# Patient Record
Sex: Female | Born: 1970 | Race: White | Hispanic: No | Smoking: Current every day smoker
Health system: Southern US, Community
[De-identification: ages and names within clinical notes are randomized; demographics above are authoritative.]

## PROBLEM LIST (undated history)

## (undated) DIAGNOSIS — Z720 Tobacco use: Secondary | ICD-10-CM

## (undated) DIAGNOSIS — I480 Paroxysmal atrial fibrillation: Secondary | ICD-10-CM

## (undated) HISTORY — DX: Paroxysmal atrial fibrillation: I48.0

## (undated) HISTORY — DX: Morbid (severe) obesity due to excess calories: E66.01

---

## 2004-12-29 ENCOUNTER — Emergency Department: Payer: Self-pay | Admitting: Emergency Medicine

## 2006-07-21 ENCOUNTER — Emergency Department: Payer: Self-pay | Admitting: Internal Medicine

## 2009-10-27 ENCOUNTER — Ambulatory Visit: Payer: Self-pay | Admitting: Adult Health

## 2015-08-11 ENCOUNTER — Encounter: Payer: Self-pay | Admitting: Emergency Medicine

## 2015-08-11 ENCOUNTER — Emergency Department: Payer: Medicaid Other

## 2015-08-11 ENCOUNTER — Observation Stay
Admission: EM | Admit: 2015-08-11 | Discharge: 2015-08-12 | Disposition: A | Discharge status: Home / Self Care | Payer: Medicaid Other | Attending: Internal Medicine | Admitting: Internal Medicine

## 2015-08-11 DIAGNOSIS — E669 Obesity, unspecified: Secondary | ICD-10-CM | POA: Diagnosis not present

## 2015-08-11 DIAGNOSIS — F17200 Nicotine dependence, unspecified, uncomplicated: Secondary | ICD-10-CM | POA: Insufficient documentation

## 2015-08-11 DIAGNOSIS — F419 Anxiety disorder, unspecified: Secondary | ICD-10-CM | POA: Insufficient documentation

## 2015-08-11 DIAGNOSIS — I4891 Unspecified atrial fibrillation: Secondary | ICD-10-CM | POA: Diagnosis present

## 2015-08-11 DIAGNOSIS — G4730 Sleep apnea, unspecified: Secondary | ICD-10-CM | POA: Insufficient documentation

## 2015-08-11 DIAGNOSIS — J40 Bronchitis, not specified as acute or chronic: Secondary | ICD-10-CM | POA: Insufficient documentation

## 2015-08-11 DIAGNOSIS — R000 Tachycardia, unspecified: Secondary | ICD-10-CM | POA: Insufficient documentation

## 2015-08-11 DIAGNOSIS — R42 Dizziness and giddiness: Secondary | ICD-10-CM | POA: Diagnosis not present

## 2015-08-11 DIAGNOSIS — R0602 Shortness of breath: Secondary | ICD-10-CM | POA: Insufficient documentation

## 2015-08-11 DIAGNOSIS — Z7952 Long term (current) use of systemic steroids: Secondary | ICD-10-CM | POA: Diagnosis not present

## 2015-08-11 DIAGNOSIS — F439 Reaction to severe stress, unspecified: Secondary | ICD-10-CM | POA: Insufficient documentation

## 2015-08-11 DIAGNOSIS — R002 Palpitations: Secondary | ICD-10-CM | POA: Diagnosis not present

## 2015-08-11 DIAGNOSIS — F17210 Nicotine dependence, cigarettes, uncomplicated: Secondary | ICD-10-CM | POA: Diagnosis not present

## 2015-08-11 DIAGNOSIS — I480 Paroxysmal atrial fibrillation: Secondary | ICD-10-CM | POA: Diagnosis not present

## 2015-08-11 HISTORY — DX: Tobacco use: Z72.0

## 2015-08-11 LAB — CBC WITH DIFFERENTIAL/PLATELET
BASOS ABS: 0.1 10*3/uL (ref 0–0.1)
Basophils Relative: 1 %
EOS ABS: 0.5 10*3/uL (ref 0–0.7)
EOS PCT: 5 %
HCT: 45.7 % (ref 35.0–47.0)
Hemoglobin: 15.5 g/dL (ref 12.0–16.0)
LYMPHS PCT: 25 %
Lymphs Abs: 2.4 10*3/uL (ref 1.0–3.6)
MCH: 29.9 pg (ref 26.0–34.0)
MCHC: 33.9 g/dL (ref 32.0–36.0)
MCV: 88.4 fL (ref 80.0–100.0)
Monocytes Absolute: 0.9 10*3/uL (ref 0.2–0.9)
Monocytes Relative: 9 %
NEUTROS PCT: 60 %
Neutro Abs: 5.9 10*3/uL (ref 1.4–6.5)
PLATELETS: 202 10*3/uL (ref 150–440)
RBC: 5.17 MIL/uL (ref 3.80–5.20)
RDW: 13.5 % (ref 11.5–14.5)
WBC: 9.7 10*3/uL (ref 3.6–11.0)

## 2015-08-11 LAB — BASIC METABOLIC PANEL
ANION GAP: 6 (ref 5–15)
BUN: 13 mg/dL (ref 6–20)
CO2: 26 mmol/L (ref 22–32)
Calcium: 9.2 mg/dL (ref 8.9–10.3)
Chloride: 109 mmol/L (ref 101–111)
Creatinine, Ser: 0.73 mg/dL (ref 0.44–1.00)
GFR calc Af Amer: >60 mL/min (ref >60)
Glucose, Bld: 105 mg/dL — ABNORMAL HIGH (ref 65–99)
POTASSIUM: 3.8 mmol/L (ref 3.5–5.1)
SODIUM: 141 mmol/L (ref 135–145)

## 2015-08-11 LAB — TSH: TSH: 2.469 u[IU]/mL (ref 0.350–4.500)

## 2015-08-11 LAB — TROPONIN I: Troponin I: <0.03 ng/mL (ref <0.031)

## 2015-08-11 LAB — T4, FREE: FREE T4: 0.75 ng/dL (ref 0.61–1.12)

## 2015-08-11 MED ORDER — ACETAMINOPHEN 325 MG PO TABS
650.0000 mg | ORAL_TABLET | Freq: Four times a day (QID) | ORAL | Status: DC | PRN
Start: 2015-08-11 — End: 2015-08-12

## 2015-08-11 MED ORDER — ACETAMINOPHEN 500 MG PO TABS
1000.0000 mg | ORAL_TABLET | Freq: Once | ORAL | Status: AC
  Administered 2015-08-11: 1000 mg via ORAL
  Filled 2015-08-11: qty 2

## 2015-08-11 MED ORDER — DILTIAZEM HCL 30 MG PO TABS
30.0000 mg | ORAL_TABLET | Freq: Once | ORAL | Status: AC
  Administered 2015-08-11: 30 mg via ORAL
  Filled 2015-08-11: qty 1

## 2015-08-11 MED ORDER — NICOTINE 10 MG IN INHA: 1.0000 | RESPIRATORY_TRACT | Status: DC | PRN

## 2015-08-11 MED ORDER — ACETAMINOPHEN 650 MG RE SUPP: 650.0000 mg | Freq: Four times a day (QID) | RECTAL | Status: DC | PRN

## 2015-08-11 MED ORDER — HEPARIN SODIUM (PORCINE) 5000 UNIT/ML IJ SOLN
5000.0000 [IU] | Freq: Three times a day (TID) | INTRAMUSCULAR | Status: DC
  Administered 2015-08-11 – 2015-08-12 (×3): 5000 [IU] via SUBCUTANEOUS
  Filled 2015-08-11 (×3): qty 1

## 2015-08-11 MED ORDER — ASPIRIN EC 81 MG PO TBEC
81.0000 mg | DELAYED_RELEASE_TABLET | Freq: Every day | ORAL | Status: DC
  Administered 2015-08-11 – 2015-08-12 (×2): 81 mg via ORAL
  Filled 2015-08-11: qty 1

## 2015-08-11 MED ORDER — SODIUM CHLORIDE 0.9 % IJ SOLN: 3.0000 mL | Freq: Two times a day (BID) | INTRAMUSCULAR | Status: DC

## 2015-08-11 MED ORDER — SODIUM CHLORIDE 0.9 % IJ SOLN: 3.0000 mL | INTRAMUSCULAR | Status: DC | PRN

## 2015-08-11 MED ORDER — MORPHINE SULFATE (PF) 2 MG/ML IV SOLN
2.0000 mg | INTRAVENOUS | Status: DC | PRN
  Administered 2015-08-12: 2 mg via INTRAVENOUS
  Filled 2015-08-11: qty 1

## 2015-08-11 MED ORDER — ONDANSETRON HCL 4 MG/2ML IJ SOLN: 4.0000 mg | Freq: Four times a day (QID) | INTRAMUSCULAR | Status: DC | PRN

## 2015-08-11 MED ORDER — NICOTINE 21 MG/24HR TD PT24
21.0000 mg | MEDICATED_PATCH | Freq: Every day | TRANSDERMAL | Status: DC
  Administered 2015-08-11 – 2015-08-12 (×2): 21 mg via TRANSDERMAL
  Filled 2015-08-11 (×2): qty 1

## 2015-08-11 MED ORDER — OXYCODONE HCL 5 MG PO TABS
5.0000 mg | ORAL_TABLET | ORAL | Status: DC | PRN
  Administered 2015-08-11 – 2015-08-12 (×3): 5 mg via ORAL
  Filled 2015-08-11 (×3): qty 1

## 2015-08-11 MED ORDER — ONDANSETRON HCL 4 MG PO TABS: 4.0000 mg | ORAL_TABLET | Freq: Four times a day (QID) | ORAL | Status: DC | PRN

## 2015-08-11 NOTE — ED Notes (Signed)
Report given to floor

## 2015-08-11 NOTE — ED Provider Notes (Signed)
[facility] Emergency Department Provider Note  ____________________________________________  Time seen: Approximately 6:15 PM  I have reviewed the triage vital signs and the nursing notes.   HISTORY  Chief Complaint Atrial Fibrillation    HPI Dominique Sullivan is a 44 y.o. female no chronic medical problems who presents for evaluation of sensation of heart racing, gradual onset today, constant since onset, severe at its maximum but currently mild. The patient reports that she awoke this morning feeling fatigued, tired. She took a nap earlier in the day and when she awoke she continued to feel fatigued, tired, lightheaded but then noted that her heart rate was fast and skipping from time to time. She stopped at the paramedic station where she was noted to be in new onset atrial fibrillation with rapid ventricular rate of 160-180 bpm. She received 20 mg of IV Cardizem with good result. She denies any chest pain, she is having mild shortness of breath. She recently finished antibiotics for bronchitis however has otherwise been in good health.   No past medical history on file.  There are no active problems to display for this patient.   Past Surgical History  Procedure Laterality Date  . Cesarean section      No current outpatient prescriptions on file.  Allergies Review of patient's allergies indicates no known allergies.  No family history on file.  Social History Social History  Substance Use Topics  . Smoking status: Current Every Day Smoker -- 1.00 packs/day  . Smokeless tobacco: Not on file  . Alcohol Use: No    Review of Systems Constitutional: No fever/chills Eyes: No visual changes. ENT: No sore throat. Cardiovascular: Denies chest pain. Respiratory: + shortness of breath. Gastrointestinal: No abdominal pain.  No nausea, no vomiting.  No diarrhea.  No constipation. Genitourinary: Negative for dysuria. Musculoskeletal: Negative for back  pain. Skin: Negative for rash. Neurological: Negative for headaches, focal weakness or numbness.  10-point ROS otherwise negative.  ____________________________________________   PHYSICAL EXAM:  VITAL SIGNS: ED Triage Vitals  Enc Vitals Group     BP 08/11/15 1730 113/82 mmHg     Pulse Rate 08/11/15 1732 80     Resp 08/11/15 1730 20     Temp 08/11/15 1732 98.7 F (37.1 C)     Temp Source 08/11/15 1732 Oral     SpO2 08/11/15 1732 95 %     Weight 08/11/15 1726 230 lb (104.327 kg)     Height 08/11/15 1726  (1.626 m)     Head Cir --      Peak Flow --      Pain Score 08/11/15 1835 2     Pain Loc --      Pain Edu? --      Excl. in GC? --     Constitutional: Alert and oriented. Well appearing and in no acute distress. Eyes: Conjunctivae are normal. PERRL. EOMI. Head: Atraumatic. Nose: No congestion/rhinnorhea. Mouth/Throat: Mucous membranes are moist.  Oropharynx non-erythematous. Neck: No stridor.  Cardiovascular: Normal rate, irrregular rhythm. Grossly normal heart sounds.  Good peripheral circulation. Respiratory: Normal respiratory effort.  No retractions. Lungs CTAB. Gastrointestinal: Soft and nontender. No distention.  No CVA tenderness. Genitourinary: deferred Musculoskeletal: No lower extremity tenderness nor edema.  No joint effusions. Neurologic:  Normal speech and language. No gross focal neurologic deficits are appreciated. No gait instability. Skin:  Skin is warm, dry and intact. No rash noted. Psychiatric: Mood and affect are normal. Speech and behavior are normal.  ____________________________________________   LABS (all labs ordered are listed, but only abnormal results are displayed)  Labs Reviewed  BASIC METABOLIC PANEL - Abnormal; Notable for the following:    Glucose, Bld 105 (*)    All other components within normal limits  CBC WITH DIFFERENTIAL/PLATELET  TROPONIN I  TSH  T4, FREE   ____________________________________________  EKG  ED  ECG REPORT I, Gayla DossGayle, Maimouna Rondeau A, the attending physician, personally viewed and interpreted this ECG.   Date: 08/11/2015  EKG Time: 17:39  Rate: 93  Rhythm: atrial fibrillation, rate 93  Axis: normal  Intervals:none  ST&T Change: No acute ST elevation.  ____________________________________________  RADIOLOGY  CXR  IMPRESSION: No edema or consolidation.  ____________________________________________   PROCEDURES  Procedure(s) performed: None  Critical Care performed: No  ____________________________________________   INITIAL IMPRESSION / ASSESSMENT AND PLAN / ED COURSE  Pertinent labs & imaging results that were available during my care of the patient were reviewed by me and considered in my medical decision making (see chart for details).  Dominique Sullivan is a 44 y.o. female no chronic medical problems who presents for evaluation of sensation of heart racing, gradual onset today, constant since onset, severe at its maximum but currently mild. On arrival to the emergency department, heart rate ranging from 80 bpm to approximately 110 bpm. She is maintaining adequate blood pressure. She still is experiencing palpitations and mild shortness of breath but has no chest pain. Labs reviewed. Normal CBC, BMP, negative troponin, normal TSH and T4. Chest x-ray shows no acute cardiopulmonary process. Clinical picture is consistent with new-onset atrial fibrillation with rapid ventricular rate, symptoms ongoing since this morning. We'll give oral Cardizem. Case discussed with the hospitalist, Dr. Hilton SinclairWeiting, for admission at 8:00 pm. ____________________________________________   FINAL CLINICAL IMPRESSION(S) / ED DIAGNOSES  Final diagnoses:  New onset a-fib (HCC)  Atrial fibrillation with RVR (HCC)      Gayla DossEryka A Azure Budnick, MD 08/11/15 2206

## 2015-08-11 NOTE — H&P (Signed)
[facility] - [place] at [facility]   PATIENT NAME: Dominique Sullivan    MR#:  [id number]  DATE OF BIRTH:  04/11/1971   DATE OF ADMISSION:  08/11/2015  PRIMARY CARE PHYSICIAN: No primary care provider on file.   REQUESTING/REFERRING PHYSICIAN: Inocencio Homes  CHIEF COMPLAINT:   Chief Complaint  Patient presents with  . Atrial Fibrillation    finished antibiotics for bronchitis recently   Palpitations HISTORY OF PRESENT ILLNESS:  Dominique Sullivan  is a 44 y.o. female without significant medical history who is presenting with acute onset of palpitations. She describes heart fluttering sensation which occurred at rest associated lightheadedness, shortness of breath, diaphoresis she went to EMS and found to be tachycardic heart rate in the 180s, she received Cardizem 1 prior to the hospital symptoms have resolved currently. Denies any chest pain, further symptomatology at this time Of note recently finished a course of antibiotics for bronchitis she is also receiving albuterol and taking Sudafed  PAST MEDICAL HISTORY:  History reviewed. No pertinent past medical history.  PAST SURGICAL HISTORY:   Past Surgical History  Procedure Laterality Date  . Cesarean section      SOCIAL HISTORY:   Social History  Substance Use Topics  . Smoking status: Current Every Day Smoker -- 1.00 packs/day  . Smokeless tobacco: Not on file  . Alcohol Use: No    FAMILY HISTORY:   Family History  Problem Relation Age of Onset  . Diabetes Neg Hx     DRUG ALLERGIES:  No Known Allergies  REVIEW OF SYSTEMS:  REVIEW OF SYSTEMS:  CONSTITUTIONAL: Denies fevers, chills, fatigue, weakness.  EYES: Denies blurred vision, double vision, or eye pain.  EARS, NOSE, THROAT: Denies tinnitus, ear pain, hearing loss.  RESPIRATORY: denies cough, shortness of breath, wheezing  CARDIOVASCULAR: Denies chest pain, positive palpitations, denies edema.  GASTROINTESTINAL: Denies nausea, vomiting,  diarrhea, abdominal pain.  GENITOURINARY: Denies dysuria, hematuria.  ENDOCRINE: Denies nocturia or thyroid problems. HEMATOLOGIC AND LYMPHATIC: Denies easy bruising or bleeding.  SKIN: Denies rash or lesions.  MUSCULOSKELETAL: Denies pain in neck, back, shoulder, knees, hips, or further arthritic symptoms.  NEUROLOGIC: Denies paralysis, paresthesias.  PSYCHIATRIC: Denies anxiety or depressive symptoms. Otherwise full review of systems performed by me is negative.   MEDICATIONS AT HOME:   Prior to Admission medications   Medication Sig Start Date End Date Taking? Authorizing Provider  PROAIR RESPICLICK 108 (90 Base) MCG/ACT AEPB Inhale 1-2 puffs into the lungs every 4 (four) hours as needed. 07/24/15  Yes Historical Provider, MD  amoxicillin-clavulanate (AUGMENTIN) 875-125 MG tablet Take 1 tablet by mouth 2 (two) times daily. completed 07/24/15   Historical Provider, MD  predniSONE (DELTASONE) 20 MG tablet Take 2 tablets by mouth daily. completed 07/24/15   Historical Provider, MD      VITAL SIGNS:  Blood pressure 108/87, pulse 87, temperature 98.7 F (37.1 C), temperature source Oral, resp. rate 15, height  (1.626 m), weight 230 lb (104.327 kg), SpO2 94 %.  PHYSICAL EXAMINATION:  VITAL SIGNS: Filed Vitals:   08/11/15 1920 08/11/15 2030  BP: 113/80 108/87  Pulse:  87  Temp:    Resp:  15   GENERAL:44 y.o.female currently in no acute distress.  HEAD: Normocephalic, atraumatic.  EYES: Pupils equal, round, reactive to light. Extraocular muscles intact. No scleral icterus.  MOUTH: Moist mucosal membrane. Dentition intact. No abscess noted.  EAR, NOSE, THROAT: Clear without exudates. No external lesions.  NECK: Supple. No thyromegaly. No nodules. No  JVD.  PULMONARY: Clear to ascultation, without wheeze rails or rhonci. No use of accessory muscles, Good respiratory effort. good air entry bilaterally CHEST: Nontender to palpation.  CARDIOVASCULAR: S1 and S2. Regular rate and  rhythm. No murmurs, rubs, or gallops. No edema. Pedal pulses 2+ bilaterally.  GASTROINTESTINAL: Soft, nontender, nondistended. No masses. Positive bowel sounds. No hepatosplenomegaly.  MUSCULOSKELETAL: No swelling, clubbing, or edema. Range of motion full in all extremities.  NEUROLOGIC: Cranial nerves II through XII are intact. No gross focal neurological deficits. Sensation intact. Reflexes intact.  SKIN: No ulceration, lesions, rashes, or cyanosis. Skin warm and dry. Turgor intact.  PSYCHIATRIC: Mood, affect within normal limits. The patient is awake, alert and oriented x 3. Insight, judgment intact.    LABORATORY PANEL:   CBC  Recent Labs Lab 08/11/15 1746  WBC 9.7  HGB 15.5  HCT 45.7  PLT 202   ------------------------------------------------------------------------------------------------------------------  Chemistries   Recent Labs Lab 08/11/15 1746  NA 141  K 3.8  CL 109  CO2 26  GLUCOSE 105*  BUN 13  CREATININE 0.73  CALCIUM 9.2   ------------------------------------------------------------------------------------------------------------------  Cardiac Enzymes  Recent Labs Lab 08/11/15 1746  TROPONINI <0.03   ------------------------------------------------------------------------------------------------------------------  RADIOLOGY:  Dg Chest Portable 1 View  08/11/2015  CLINICAL DATA:  Tachycardia with palpitations EXAM: PORTABLE CHEST 1 VIEW COMPARISON:  None. FINDINGS: Lungs are clear. Heart is upper normal in size with pulmonary vascularity within normal limits. No adenopathy. No bone lesions. IMPRESSION: No edema or consolidation. Electronically Signed   By: Bretta BangWilliam  Woodruff III M.D.   On: 08/11/2015 17:53    EKG:   Orders placed or performed during the hospital encounter of 08/11/15  . EKG 12-Lead  . EKG 12-Lead    IMPRESSION AND PLAN:   44 year old Caucasian female without significant known medical history who is presenting with  palpitations  1. New onset atrial fibrillation: She is currently in normal sinus rhythm, we will check TSH, echocardiogram place on telemetry trend cardiac enzymes will consult cardiology her chads score currently is 0, start aspirin 2. Venous thromboembolism prophylactic: Heparin subcutaneous    All the records are reviewed and case discussed with ED provider. Management plans discussed with the patient, family and they are in agreement.  CODE STATUS: Full  TOTAL TIME TAKING CARE OF THIS PATIENT: 35 minutes.    Hower,  Mardi MainlandDavid K M.D on 08/11/2015 at 8:56 PM  Between 7am to 6pm - Pager - [phone]  After 6pm: House Pager: - [phone]  [facility]  Office  [phone]  CC: Primary care physician; No primary care provider on file.

## 2015-08-11 NOTE — ED Notes (Signed)
Pt states she doesn't want to stay - tried to advise her why she should ie: new onset afib, no pcp to follow up with. Still wants to leave d/t caring for her mother and her mother has dr appt tomorrow. Let dr know - she will go in and speak with pt.

## 2015-08-11 NOTE — ED Notes (Signed)
Awoke with the feeling of heart racing/ palpitations. Went to ems station to be checked.

## 2015-08-12 ENCOUNTER — Encounter: Payer: Self-pay | Admitting: Physician Assistant

## 2015-08-12 ENCOUNTER — Observation Stay (HOSPITAL_BASED_OUTPATIENT_CLINIC_OR_DEPARTMENT_OTHER)
Admit: 2015-08-12 | Discharge: 2015-08-12 | Disposition: A | Discharge status: Home / Self Care | Payer: Medicaid Other | Attending: Internal Medicine | Admitting: Internal Medicine

## 2015-08-12 DIAGNOSIS — I480 Paroxysmal atrial fibrillation: Secondary | ICD-10-CM

## 2015-08-12 DIAGNOSIS — G478 Other sleep disorders: Secondary | ICD-10-CM

## 2015-08-12 DIAGNOSIS — Z720 Tobacco use: Secondary | ICD-10-CM

## 2015-08-12 DIAGNOSIS — Z638 Other specified problems related to primary support group: Secondary | ICD-10-CM

## 2015-08-12 DIAGNOSIS — F17200 Nicotine dependence, unspecified, uncomplicated: Secondary | ICD-10-CM | POA: Insufficient documentation

## 2015-08-12 DIAGNOSIS — G4730 Sleep apnea, unspecified: Secondary | ICD-10-CM | POA: Insufficient documentation

## 2015-08-12 DIAGNOSIS — I4891 Unspecified atrial fibrillation: Secondary | ICD-10-CM

## 2015-08-12 DIAGNOSIS — F439 Reaction to severe stress, unspecified: Secondary | ICD-10-CM | POA: Insufficient documentation

## 2015-08-12 DIAGNOSIS — J40 Bronchitis, not specified as acute or chronic: Secondary | ICD-10-CM

## 2015-08-12 LAB — TROPONIN I: Troponin I: <0.03 ng/mL (ref <0.031)

## 2015-08-12 LAB — HEMOGLOBIN A1C: Hgb A1c MFr Bld: 5.8 % (ref 4.0–6.0)

## 2015-08-12 MED ORDER — DILTIAZEM HCL ER COATED BEADS 120 MG PO CP24: 120.0000 mg | ORAL_CAPSULE | Freq: Every day | ORAL | Status: DC

## 2015-08-12 MED ORDER — DILTIAZEM HCL ER COATED BEADS 120 MG PO CP24
120.0000 mg | ORAL_CAPSULE | Freq: Every day | ORAL | Status: DC
  Administered 2015-08-12: 120 mg via ORAL
  Filled 2015-08-12: qty 1

## 2015-08-12 MED ORDER — DILTIAZEM HCL 30 MG PO TABS: 30.0000 mg | ORAL_TABLET | Freq: Four times a day (QID) | ORAL | Status: DC | PRN

## 2015-08-12 MED ORDER — ASPIRIN 81 MG PO TBEC
81.0000 mg | DELAYED_RELEASE_TABLET | Freq: Every day | ORAL | Status: AC
Start: 2015-08-12 — End: ?

## 2015-08-12 MED ORDER — LEVALBUTEROL TARTRATE 45 MCG/ACT IN AERO: 1.0000 | INHALATION_SPRAY | Freq: Three times a day (TID) | RESPIRATORY_TRACT | Status: DC

## 2015-08-12 MED ORDER — TIOTROPIUM BROMIDE MONOHYDRATE 18 MCG IN CAPS: 18.0000 ug | ORAL_CAPSULE | Freq: Every day | RESPIRATORY_TRACT | Status: DC

## 2015-08-12 NOTE — Progress Notes (Signed)
*  PRELIMINARY RESULTS* Echocardiogram 2D Echocardiogram has been performed.  Georgann HousekeeperJerry R Hege 08/12/2015, 9:08 AM

## 2015-08-12 NOTE — Progress Notes (Signed)
[facility] - [place] at [facility]   PATIENT NAME: Dominique Sullivan    MR#:  [id number]  DATE OF BIRTH:  12-21-1970  SUBJECTIVE admitted for atrial fibrillation with RVR: The heart rate 160-1 80 bpm received IV Cardizem. Patient is now in sinus rhythm with heart rate 80 bpm seen by cardiology. Started on Cardizem CD 120 mg daily. Patient denies any chest pain. Symptoms started after she recovered from bronchitis she took antibiotics, proair., Sudafed.   Pt denies any complaints.  CHIEF COMPLAINT:   Chief Complaint  Patient presents with  . Atrial Fibrillation    finished antibiotics for bronchitis recently    REVIEW OF SYSTEMS:    Review of Systems  Constitutional: Negative for fever and chills.  HENT: Negative for hearing loss.   Eyes: Negative for blurred vision, double vision and photophobia.  Respiratory: Negative for cough, hemoptysis and shortness of breath.   Cardiovascular: Negative for palpitations, orthopnea and leg swelling.  Gastrointestinal: Negative for vomiting, abdominal pain and diarrhea.  Genitourinary: Negative for dysuria and urgency.  Musculoskeletal: Negative for myalgias and neck pain.  Skin: Negative for rash.  Neurological: Negative for dizziness, focal weakness, seizures, weakness and headaches.  Psychiatric/Behavioral: Negative for memory loss. The patient does not have insomnia.     Nutrition:  Tolerating Diet: Tolerating PT:      DRUG ALLERGIES:  No Known Allergies  VITALS:  Blood pressure 123/74, pulse 101, temperature 98.4 F (36.9 C), temperature source Oral, resp. rate 20, height  (1.626 m), weight 104.327 kg (230 lb), last menstrual period 02/09/2014, SpO2 94 %.  PHYSICAL EXAMINATION:   Physical Exam  GENERAL:  44 y.o.-year-old patient lying in the bed with no acute distress.  EYES: Pupils equal, round, reactive to light and accommodation. No scleral icterus. Extraocular muscles intact.  HEENT: Head  atraumatic, normocephalic. Oropharynx and nasopharynx clear.  NECK:  Supple, no jugular venous distention. No thyroid enlargement, no tenderness.  LUNGS: Normal breath sounds bilaterally, no wheezing, rales,rhonchi or crepitation. No use of accessory muscles of respiration.  CARDIOVASCULAR: S1, S2 normal. No murmurs, rubs, or gallops.  ABDOMEN: Soft, nontender, nondistended. Bowel sounds present. No organomegaly or mass.  EXTREMITIES: No pedal edema, cyanosis, or clubbing.  NEUROLOGIC: Cranial nerves II through XII are intact. Muscle strength 5/5 in all extremities. Sensation intact. Gait not checked.  PSYCHIATRIC: The patient is alert and oriented x 3.  SKIN: No obvious rash, lesion, or ulcer.    LABORATORY PANEL:   CBC  Recent Labs Lab 08/11/15 1746  WBC 9.7  HGB 15.5  HCT 45.7  PLT 202   ------------------------------------------------------------------------------------------------------------------  Chemistries   Recent Labs Lab 08/11/15 1746  NA 141  K 3.8  CL 109  CO2 26  GLUCOSE 105*  BUN 13  CREATININE 0.73  CALCIUM 9.2   ------------------------------------------------------------------------------------------------------------------  Cardiac Enzymes  Recent Labs Lab 08/12/15 0944  TROPONINI <0.03   ------------------------------------------------------------------------------------------------------------------  RADIOLOGY:  Dg Chest Portable 1 View  08/11/2015  CLINICAL DATA:  Tachycardia with palpitations EXAM: PORTABLE CHEST 1 VIEW COMPARISON:  None. FINDINGS: Lungs are clear. Heart is upper normal in size with pulmonary vascularity within normal limits. No adenopathy. No bone lesions. IMPRESSION: No edema or consolidation. Electronically Signed   By: Bretta Bang III M.D.   On: 08/11/2015 17:53     ASSESSMENT AND PLAN:   Active Problems:   New onset atrial fibrillation (HCC)   Paroxysmal atrial fibrillation with rapid ventricular  response (HCC)   Bronchitis  Stress at home   Smoker   Sleep disorder breathing   1. Paroxysmal atrial fibrillation  versus new onset A. fib with RVR: Patient right now in sinus rhythm. Heart rate is stable around 90s. Continue Cardizem CD 120 mg by mouth daily, monitor on telemetry for tonight and monitor for any further arrhythmias. And patient needs outpatient sleep studies for possible sleep apnea. And she also needs 30 day  event monitor. Echocardiogram showed EF more than 60%. Continue aspirin. Cardizem. -Likely in the setting of acute bronchtis, Sudafed, prednisone, and albuterol  2. Bronchitis: Finished treatment. #3 tobacco abuse and smokes about 1 and a pack of cigarettes a day strongly advised to  stop smoking and offered assistance. likley d/c am All the records are reviewed and case discussed with Care Management/Social Workerr. Management plans discussed with the patient, family and they are in agreement.  CODE STATUS:full  TOTAL TIME TAKING CARE OF THIS PATIENT: 35minutes.   POSSIBLE D/C IN *1 DAYS, DEPENDING ON CLINICAL CONDITION.   Katha HammingKONIDENA,Sarahlynn Cisnero M.D on 08/12/2015 at 2:31 PM  Between 7am to 6pm - Pager - [phone]  After 6pm go to [email or web address] - password [email or web address]  [facility]  Office  [phone]  CC: Primary care physician; No primary care provider on file.

## 2015-08-12 NOTE — Care Management (Addendum)
Concern for atrial fibrillation.  Patient currently in NSR.  At present, patient has not been placed on cost prohibitive anticoagulation.  Patient is employed but does not have insurance.  Provide her with coupons from [email or web address] for Cardizem 120mg  CD daily ([facility] 14.91 for #30) and Cardizem 30mg  ([facility] 60 tabs 11.00) Says she will be able to pay for medication.  Provided her application to [facility]

## 2015-08-12 NOTE — Consult Note (Signed)
Cardiology Consultation Note  Patient ID: Dominique Sullivan, MRN: [id number], DOB/AGE: 02-26-1971 44 y.o. Admit date: 08/11/2015   Date of Consult: 08/12/2015 Primary Physician: No primary care provider on file. Primary Cardiologist: New to [facility]  Chief Complaint: Palpitations Reason for Consult: Possible PAF vs new onset Afib with RVR  HPI: 44 y.o. female with h/o possible PAF vs new onset Afib, obesity, possible sleep apnea, and ongoing tobacco abuse who has not seen an MD in the outpatient setting for a routine check up in longer than she can remember presented to [facility] on 12/29 in rate controlled Afib after initially presenting to EMS on her own and was found to be in Afib with RVR with heart rates in the 160s to 180s.    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 She checked her heart rate with her mother's BP cuff at that time with the machine reporting it as being irregular. She did not seek medical care. Since then she has not had any tachy-palpitations until waking up on 12/29. She was recently being treated for acute bronchitis with Augmentin, prednisone, albuterol, and Sudafed. She last took Sudafed the week prior. She was taking her ProAir bid the week piror and had stopped taking this until the evening of 12/28 when she felt like her chest was tightening, thus she took two inhalations prior to going to bed. Upon waking up on 12/29 she reports "not feeling right." She was experiencing tachy-palpitations. She attempted to take her pulse and BP with her mother's BP machine, but got an error message. She was also noted to feel clammy. She proceeded to get ready for work, but decided to stop by EMS on her way where she was found to be in new onset Afib with RVR with heart rate in the 160-180 range. She received IV Cardizem 20 mg bolus with improvement in her heart rate to the 90s. Upon her arrival [facility] she was  noted to be in rate controlled Afib with heart rates in the 90's. She was found to have troponin negative x 4, K+ 3.8, normal TSH/free T4, unremarkable CBC, SCr 0.73, A1C 5.8%. ECG showed Afib, 93 bpm, low voltage precordial leads, no significant st/t changes. CXR showed no edema or consolidation. She was given diltiazem 30 mg x 1 upon her arrival. She converted to NSR on 12/29 around 9 PM. Has had frequent short runs of sinus tach.    Past Medical History  Diagnosis Date  . Obesity   . Tobacco abuse   . A-fib [facility])     a. patient reported tachy-palpitations in 2010 (no documented Afib at that time as she did not seek medical care); b. documented Afib 07/2015      Most Recent Cardiac Studies: None   Surgical History:  Past Surgical History  Procedure Laterality Date  . Cesarean section       Home Meds: Prior to Admission medications   Medication Sig Start Date End Date Taking? Authorizing Provider  PROAIR RESPICLICK 108 (90 Base) MCG/ACT AEPB Inhale 1-2 puffs into the lungs every 4 (four) hours as needed. 07/24/15  Yes Historical Provider, MD  amoxicillin-clavulanate (AUGMENTIN) 875-125 MG tablet Take 1 tablet by mouth 2 (two) times daily. completed 07/24/15   Historical Provider, MD  predniSONE (DELTASONE) 20 MG tablet Take 2 tablets by mouth daily. completed 07/24/15   Historical Provider, MD    Inpatient Medications:  . aspirin EC  81 mg Oral Daily  . heparin  5,000 Units Subcutaneous 3 times per day  . nicotine  21 mg Transdermal Daily      Allergies: No Known Allergies  Social History   Social History  . Marital Status: Divorced    Spouse Name: N/A  . Number of Children: N/A  . Years of Education: N/A   Occupational History  . Not on file.   Social History Main Topics  . Smoking status: Current Every Day Smoker -- 1.00 packs/day for 31 years    Types: Cigarettes  . Smokeless tobacco: Not on file  . Alcohol Use: 0.0 oz/week    0 Standard drinks or equivalent  per week  . Drug Use: No     Comment: remote MJ use as teenager - denies any other illegal drug abuse  . Sexual Activity: Not on file   Other Topics Concern  . Not on file   Social History Narrative     Family History  Problem Relation Age of Onset  . Diabetes Mother   . Hypertension Mother   . Stroke Maternal Grandmother   . Stroke Maternal Grandfather   . Hyperlipidemia Father      Review of Systems: Review of Systems  Constitutional: Positive for malaise/fatigue. Negative for fever, chills, weight loss and diaphoresis.       Improved  HENT: Negative for congestion.   Eyes: Negative for discharge and redness.  Respiratory: Negative for cough, hemoptysis, sputum production, shortness of breath and wheezing.   Cardiovascular: Positive for palpitations. Negative for chest pain, orthopnea, claudication, leg swelling and PND.       Improved  Gastrointestinal: Negative for nausea and vomiting.  Musculoskeletal: Positive for back pain. Negative for falls.  Skin: Negative for rash.  Neurological: Positive for weakness. Negative for dizziness, sensory change, speech change, focal weakness and loss of consciousness.  Endo/Heme/Allergies: Does not bruise/bleed easily.  Psychiatric/Behavioral: Positive for substance abuse. The patient is nervous/anxious.        Ongoing tobacco abuse, rare etoh use, remote MJ abuse as a teenager - denies any other illegal drug abuse  All other systems reviewed and are negative.    Labs:  Recent Labs  08/11/15 1746 08/11/15 2257 08/12/15 0340 08/12/15 0944  TROPONINI <0.03 <0.03 <0.03 <0.03   Lab Results  Component Value Date   WBC 9.7 08/11/2015   HGB 15.5 08/11/2015   HCT 45.7 08/11/2015   MCV 88.4 08/11/2015   PLT 202 08/11/2015     Recent Labs Lab 08/11/15 1746  NA 141  K 3.8  CL 109  CO2 26  BUN 13  CREATININE 0.73  CALCIUM 9.2  GLUCOSE 105*   No results found for: CHOL, HDL, LDLCALC, TRIG No results found for:  DDIMER  Radiology/Studies:  Dg Chest Portable 1 View  08/11/2015  CLINICAL DATA:  Tachycardia with palpitations EXAM: PORTABLE CHEST 1 VIEW COMPARISON:  None. FINDINGS: Lungs are clear. Heart is upper normal in size with pulmonary vascularity within normal limits. No adenopathy. No bone lesions. IMPRESSION: No edema or consolidation. Electronically Signed   By: Bretta BangWilliam  Woodruff III M.D.   On: 08/11/2015 17:53    EKG:  Afib, 93 bpm, low voltage precordial leads, no significant st/t changes  Weights: Filed Weights   08/11/15 1726  Weight: 230 lb (104.327 kg)     Physical Exam: Blood pressure 113/80, pulse 85, temperature 98.6 F (37 C), temperature source Oral, resp. rate 16, height 5\' 4"  (1.626 m), weight 230  lb (104.327 kg), last menstrual period 02/09/2014, SpO2 98 %. Body mass index is 39.46 kg/(m^2). General: Well developed, well nourished, in no acute distress. Head: Normocephalic, atraumatic, sclera non-icteric, no xanthomas, nares are without discharge.  Neck: Negative for carotid bruits. JVD not elevated. Lungs: Clear bilaterally to auscultation without wheezes, rales, or rhonchi. Breathing is unlabored. Heart: RRR with S1 S2. No murmurs, rubs, or gallops appreciated. Abdomen: Obese, soft, non-tender, non-distended with normoactive bowel sounds. No hepatomegaly. No rebound/guarding. No obvious abdominal masses. Msk:  Strength and tone appear normal for age. Extremities: No clubbing or cyanosis. No edema.  Distal pedal pulses are 2+ and equal bilaterally. Neuro: Alert and oriented X 3. No facial asymmetry. No focal deficit. Moves all extremities spontaneously. Psych:  Responds to questions appropriately with a normal affect.    Assessment and Plan:   1. PAF vs new onset Afib with RVR: -Currently in sinus rhythm with heart rate in the 90's to low 100's with movement -Would want to decrease heart rate to further decrease chances of going back into Afib  -Likely in the  setting of acute broncitis, Sudafed, prednisone, and albuterol  -Cannot rule out undiagnosed sleep apnea. Recommend outpatient sleep study (possible snoring, she lives with her deaf mother) -Under significant amount of stress with her living situation/mother/children -Add Cardizem 120 mg daily with diltiazem 30 mg prn tachy-palpitations  -Very symptomatic with her tachy-palpitations  -Plan for outpatient follow up to assess how she is doing. Consider 30 day event monitor at that time should she be having further tachy-palpitations given this was her 2nd episode in 6 years  -Echo pending to evaluate LV function and wall motion  -CHADS2VASc at least 1 (female), making risk of long term, full-dose anticoagulation greater than benefit at this time  2. Recent bronchitis: -Was taking Augmentin, prednisone, albuterol, and Sudafed. Most recently took [employer] just prior to the onset of her Afib  -Her acute infection and medications above certainly are likely to play a role in her new onset Afib  3. Possible sleep apnea/obesity: -Recommend outpatient sleep study -Weight loss advised  4. Tobacco abuse: -Cessation advised  5. Stress/anxiety: -Consider yoga/meditation   Signed, Eula Listen, PA-C Pager: [phone] 08/12/2015, 11:00 AM

## 2015-08-15 MED ORDER — DILTIAZEM HCL 30 MG PO TABS: 30.0000 mg | ORAL_TABLET | Freq: Four times a day (QID) | ORAL | Status: DC | PRN

## 2015-08-16 ENCOUNTER — Telehealth: Payer: Self-pay

## 2015-08-16 MED ORDER — DILTIAZEM HCL 30 MG PO TABS: 30.0000 mg | ORAL_TABLET | Freq: Four times a day (QID) | ORAL | Status: DC | PRN

## 2015-08-16 NOTE — Telephone Encounter (Signed)
-----   Message from Christell Faithraci L Tekely sent at 08/16/2015  1:23 PM EST ----- Regarding: tcm/ph 08/26/15 @ 9:30 C. Brion AlimentBerge, NP

## 2015-08-16 NOTE — Discharge Summary (Signed)
Dominique Sullivan, is a 45 y.o. female  DOB Nov 07, 1970  MRN [id number].  Admission date:  08/11/2015  Admitting Physician  Wyatt Hasteavid K Hower, MD  Discharge Date:  08/12/2015   Primary MD  No primary care provider on file.  Recommendations for primary care physician for things to follow:  Follow up with Dr.Tim Gollan    Admission Diagnosis  New onset a-fib [facility](HCC) [I48.91] Atrial fibrillation with RVR (HCC) [I48.91]   Discharge Diagnosis  New onset a-fib (HCC) [I48.91] Atrial fibrillation with RVR (HCC) [I48.91]    Active Problems:   New onset atrial fibrillation (HCC)   Paroxysmal atrial fibrillation with rapid ventricular response (HCC)   Bronchitis   Stress at home   Smoker   Sleep disorder breathing      Past Medical History  Diagnosis Date  . Obesity   . Tobacco abuse   . A-fib [facility](HCC)     a. patient reported tachy-palpitations in 2010 (no documented Afib at that time as she did not seek medical care); b. documented Afib 07/2015    Past Surgical History  Procedure Laterality Date  . Cesarean section         History of present illness and  Hospital Course:     Kindly see H&P for history of present illness and admission details, please review complete Labs, Consult reports and Test reports for all details in brief  HPI  from the history and physical done on the day of admission Dominique Sullivan is a 45 y.o. female without significant medical history who is presenting with acute onset of palpitations. She describes heart fluttering sensation which occurred at rest associated lightheadedness, shortness of breath, diaphoresis she went to EMS and found to be tachycardic heart rate in the 180s, she received Cardizem 1 prior to the hospital symptoms have resolved .admitted to hospital ist for eval of new onset   tafib.   Hospital Course  New onset afib;coverted to NSR.seen by cardio,had PAT,started on cardizem cd 120 mg daily and cardizem 30 mg po daily  Prn for  Tachycardia and symptomatic palpitations.HR stayed in 80-90s rest of the day on Dec 30th,so we discharged her the patient supposed to follow with cardio for 30 day event monitor .Echo showed EF more than 55% with normal valves. Also concerned about possible seep apnea;needs out pt sleep studies, Symptoms  Of palpitations likely triggered by recent bronchitis ,was taking proair and sudafed, 2.heavy tobacco abuse;conselled against smoking.  Discharge Condition: stable   Follow UP  Follow-up Information    Follow up with Julien Nordmannimothy Gollan, MD In 1 week.   Specialty:  Cardiology   Contact information:   [address] [place] [address] [phone]         Discharge Instructions  and  Discharge Medications       Medication List    STOP taking these medications        predniSONE 20 MG tablet  Commonly known as:  DELTASONE     PROAIR RESPICLICK 108 (90 Base) MCG/ACT Aepb  Generic drug:  Albuterol Sulfate      TAKE these medications        amoxicillin-clavulanate 875-125 MG tablet  Commonly known as:  AUGMENTIN  Take 1 tablet by mouth 2 (two) times daily. completed     aspirin 81 MG EC tablet  Take 1 tablet (81 mg total) by mouth daily.     diltiazem 120 MG 24 hr capsule  Commonly known as:  CARDIZEM CD  Take 1 capsule (120 mg total) by mouth daily.     levalbuterol 45 MCG/ACT inhaler  Commonly known as:  XOPENEX HFA  Inhale 1 puff into the lungs 3 (three) times daily.     tiotropium 18 MCG inhalation capsule  Commonly known as:  SPIRIVA HANDIHALER  Place 1 capsule (18 mcg total) into inhaler and inhale daily at 2 am.          Diet and Activity recommendation: See Discharge Instructions above   Consults obtained -cardio   Major procedures and Radiology Reports - PLEASE review detailed  and final reports for all details, in brief -     Dg Chest Portable 1 View  08/11/2015  CLINICAL DATA:  Tachycardia with palpitations EXAM: PORTABLE CHEST 1 VIEW COMPARISON:  None. FINDINGS: Lungs are clear. Heart is upper normal in size with pulmonary vascularity within normal limits. No adenopathy. No bone lesions. IMPRESSION: No edema or consolidation. Electronically Signed   By: Bretta Bang III M.D.   On: 08/11/2015 17:53    Micro Results     No results found for this or any previous visit (from the past 240 hour(s)).     Today   Subjective:   Dominique Sullivan today has no headache,no chest abdominal pain,wants to go home,  Objective:   Blood pressure 123/74, pulse 101, temperature 98.4 F (36.9 C), temperature source Oral, resp. rate 20, height 5\' 4"  (1.626 m), weight 104.327 kg (230 lb), last menstrual period 02/09/2014, SpO2 94 %.  No intake or output data in the 24 hours ending 08/16/15 1714  Exam Awake Alert, Oriented x 3, No new F.N deficits, Normal affect [place].AT,PERRAL Supple Neck,No JVD, No cervical lymphadenopathy appriciated.  Symmetrical Chest wall movement, Good air movement bilaterally, CTAB RRR,No Gallops,Rubs or new Murmurs, No Parasternal Heave +ve B.Sounds, Abd Soft, Non tender, No organomegaly appriciated, No rebound -guarding or rigidity. No Cyanosis, Clubbing or edema, No new Rash or bruise  Data Review   CBC w Diff:  Lab Results  Component Value Date   WBC 9.7 08/11/2015   HGB 15.5 08/11/2015   HCT 45.7 08/11/2015   PLT 202 08/11/2015   LYMPHOPCT 25 08/11/2015   MONOPCT 9 08/11/2015   EOSPCT 5 08/11/2015   BASOPCT 1 08/11/2015    CMP:  Lab Results  Component Value Date   NA 141 08/11/2015   K 3.8 08/11/2015   CL 109 08/11/2015   CO2 26 08/11/2015   BUN 13 08/11/2015   CREATININE 0.73 08/11/2015  .   Total Time in preparing paper work, data evaluation and todays exam - 35 minutes  Dominique Sullivan M.D on 08/16/2015 at  5:14 PM    Note: This dictation was prepared with Dragon dictation along with smaller phrase technology. Any transcriptional errors that result from this process are unintentional.

## 2015-08-16 NOTE — Telephone Encounter (Signed)
[facility].  Patient understands to follow up with Ward Givenshris Berge, NP on 08/26/15 at 9:30 [facility]. Patient understands discharge instructions? yes Patient understands medications and regiment? yes Patient understands to bring all medications to this visit? yes  Pt requests refill on her cardizem 30 mg, as she states that the pharmacy did not receive this rx. Pt is unable to afford her inhalers, did not pick these up. She has applied for financial assistance and [facility].

## 2015-08-26 ENCOUNTER — Ambulatory Visit (INDEPENDENT_AMBULATORY_CARE_PROVIDER_SITE_OTHER): Payer: Self-pay | Admitting: Nurse Practitioner

## 2015-08-26 ENCOUNTER — Encounter: Payer: Self-pay | Admitting: Nurse Practitioner

## 2015-08-26 VITALS — BP 120/84 | HR 88 | Ht 64.0 in | Wt 232.8 lb

## 2015-08-26 DIAGNOSIS — I480 Paroxysmal atrial fibrillation: Secondary | ICD-10-CM

## 2015-08-26 DIAGNOSIS — I4891 Unspecified atrial fibrillation: Secondary | ICD-10-CM

## 2015-08-26 DIAGNOSIS — Z720 Tobacco use: Secondary | ICD-10-CM | POA: Insufficient documentation

## 2015-08-26 NOTE — Patient Instructions (Signed)
Medication Instructions:  Please continue current medications  Labwork: None  Testing/Procedures: None  Follow-Up: Your physician wants you to follow-up in: 3 months w/ Dr. Billey CoGollan  You will receive a reminder letter in the mail two months in advance.  If you don't receive a letter, please call our office to schedule the follow-up appointment.  If you need a refill on your cardiac medications before your next appointment, please call your pharmacy.

## 2015-08-26 NOTE — Progress Notes (Signed)
Office Visit    Patient Name: Dominique Sullivan Date of Encounter: 08/26/2015  Primary Care Provider:  No primary care provider on file. Primary Cardiologist:  Concha Se. Gollan, MD   Chief Complaint     45 year old female with recent hospitalization related  2 palpitations and atrial fibrillation who presents for follow-up.  Past Medical History    Past Medical History  Diagnosis Date  . Morbid obesity (HCC)   . Tobacco abuse   . PAF (paroxysmal atrial fibrillation) (HCC)     a. patient reported tachy-palpitations in 2010 (no documented Afib at that time as she did not seek medical care); b. documented Afib 07/2015, CHA2DS2VASc = 1 (ASA only);  c. 07/2015 Echo: EF 60-65%, nl LA size, nl RV.   Past Surgical History  Procedure Laterality Date  . Cesarean section      Allergies  No Known Allergies  History of Present Illness     45 year old female with a prior history of palpitations , obesity, and tobacco abuse. She was recently admitted to [facility] secondary to palpitations and found to be in atrial fibrillation. This converted with IV diltiazem and she was later switched to oral diltiazem. Aspirin was started. Echocardiogram showed normal LV function. She presents for follow-up. Since discharge, she has not had any significant palpitations. She denies any prior history of chest pain or dyspnea on exertion. She is tolerating oral diltiazem therapy but has noted mild constipation.  Home Medications    Prior to Admission medications   Medication Sig Start Date End Date Taking? Authorizing Provider  aspirin EC 81 MG EC tablet Take 1 tablet (81 mg total) by mouth daily. 08/12/15  Yes Alford Highlandichard Wieting, MD  diltiazem (CARDIZEM CD) 120 MG 24 hr capsule Take 1 capsule (120 mg total) by mouth daily. 08/12/15  Yes Katha HammingSnehalatha Konidena, MD  diltiazem (CARDIZEM) 30 MG tablet Take 1 tablet (30 mg total) by mouth every 6 (six) hours as needed (for palpitations or afib). 08/16/15  Yes  Antonieta Ibaimothy J Gollan, MD    Review of Systems    As above, doing well  Since discharge without chest pain, palpitations , PND, orthopnea, dizziness, syncope, edema, or early satiety.  All other systems reviewed and are otherwise negative except as noted above.  Physical Exam    VS:  BP 120/84 mmHg  Pulse 88  Ht 5\' 4"  (1.626 m)  Wt 232 lb 12.8 oz (105.597 kg)  BMI 39.94 kg/m2  LMP 02/09/2014 , BMI Body mass index is 39.94 kg/(m^2). GEN: Well nourished, well developed, in no acute distress. HEENT: normal. Neck: Supple, no JVD, carotid bruits, or masses. Cardiac: RRR, no murmurs, rubs, or gallops. No clubbing, cyanosis, edema.  Radials/DP/PT 2+ and equal bilaterally.  Respiratory:  Respirations regular and unlabored, clear to auscultation bilaterally. GI: Soft, nontender, nondistended, BS + x 4. MS: no deformity or atrophy. Skin: warm and dry, no rash. Neuro:  Strength and sensation are intact. Psych: Normal affect.  Accessory Clinical Findings    ECG -  Regular sinus rhythm, 88, no acute ST or T changes.  Assessment & Plan    1.   Paroxysmal atrial fibrillation: status post recent hospitalization due to symptom of palpitations and A. Fib. She is now on oral diltiazem and doing well without recurrence of palpitations. CHA2DS2VASc equals 1 (female). She is on low-dose aspirin. Echocardiogram showed normal LV function with normal atrial size. Continue current therapy.   2. Morbid obesity: patient says she has changed her  diet and is hopeful to lose weight. She would like to begin exercising and I encouraged this.   3. Tobacco abuse: patient is currently smoking about half a pack per day though was previously smoking 1-1/2-2 packs per day. She does plan on quitting completely.  I encouraged her to do so.   4. ? Sleep apnea : patient has a history of snoring. She does have a large neck. She would like to defer sleep testing at this time as she would first like to lose weight to see if this  impacts her snoring.   5. Disposition: plan follow-up in 3 months or sooner if necessary.   Nicolasa Ducking, NP 08/26/2015, 12:52 PM

## 2015-09-12 ENCOUNTER — Other Ambulatory Visit: Payer: Self-pay

## 2015-09-12 MED ORDER — DILTIAZEM HCL ER COATED BEADS 120 MG PO CP24: 120.0000 mg | ORAL_CAPSULE | Freq: Every day | ORAL | Status: DC

## 2015-09-12 NOTE — Telephone Encounter (Signed)
Refill sent for cardizem cd 120 mg

## 2015-12-23 ENCOUNTER — Ambulatory Visit: Payer: Medicaid Other | Admitting: Cardiovascular Disease

## 2015-12-28 ENCOUNTER — Emergency Department
Admission: EM | Admit: 2015-12-28 | Discharge: 2015-12-28 | Disposition: A | Discharge status: Home / Self Care | Payer: Medicaid Other | Attending: Emergency Medicine | Admitting: Emergency Medicine

## 2015-12-28 ENCOUNTER — Encounter: Payer: Self-pay | Admitting: *Deleted

## 2015-12-28 DIAGNOSIS — F17210 Nicotine dependence, cigarettes, uncomplicated: Secondary | ICD-10-CM | POA: Insufficient documentation

## 2015-12-28 DIAGNOSIS — L0291 Cutaneous abscess, unspecified: Secondary | ICD-10-CM

## 2015-12-28 DIAGNOSIS — I480 Paroxysmal atrial fibrillation: Secondary | ICD-10-CM | POA: Insufficient documentation

## 2015-12-28 DIAGNOSIS — Z7982 Long term (current) use of aspirin: Secondary | ICD-10-CM | POA: Insufficient documentation

## 2015-12-28 DIAGNOSIS — Z79899 Other long term (current) drug therapy: Secondary | ICD-10-CM | POA: Insufficient documentation

## 2015-12-28 DIAGNOSIS — L02213 Cutaneous abscess of chest wall: Secondary | ICD-10-CM | POA: Insufficient documentation

## 2015-12-28 DIAGNOSIS — I4891 Unspecified atrial fibrillation: Secondary | ICD-10-CM

## 2015-12-28 MED ORDER — LIDOCAINE-EPINEPHRINE (PF) 1 %-1:200000 IJ SOLN
INTRAMUSCULAR | Status: AC
  Filled 2015-12-28: qty 30

## 2015-12-28 MED ORDER — OXYCODONE-ACETAMINOPHEN 5-325 MG PO TABS
1.0000 | ORAL_TABLET | Freq: Once | ORAL | Status: AC
  Administered 2015-12-28: 1 via ORAL
  Filled 2015-12-28: qty 1

## 2015-12-28 MED ORDER — SODIUM CHLORIDE 0.9 % IV BOLUS (SEPSIS)
1000.0000 mL | Freq: Once | INTRAVENOUS | Status: AC
  Administered 2015-12-28: 1000 mL via INTRAVENOUS

## 2015-12-28 NOTE — ED Provider Notes (Signed)
[facility] Emergency Department Provider Note   ____________________________________________  Time seen: Approximately 1225 PM  I have reviewed the triage vital signs and the nursing notes.   HISTORY  Chief Complaint Atrial Fibrillation   HPI Dominique Sullivan is a 45 y.o. female with a history of atrial fibrillation who is presenting today from urgent care with a left sided thoracic cutaneous abscess as well as rapid atrial fibrillation. Says that she has felt her heart beating quickly since this morning. She says that she has been off her atrial fibrillation medication for the past 3 weeks. She has been on 2 antibiotics for abscess which she said started developing about 10 days ago. Despite this, she said that the pain is persistent and is now draining. She is requesting incision and drainage of the abscess at this time. Denies any chest pain or shortness of breath. He is on aspirin as an anticoagulant for her atrial fibrillation and she is seen by the [facility], Dr. Mariah Milling.    Past Medical History  Diagnosis Date  . Morbid obesity (HCC)   . Tobacco abuse   . PAF (paroxysmal atrial fibrillation) (HCC)     a. patient reported tachy-palpitations in 2010 (no documented Afib at that time as she did not seek medical care); b. documented Afib 07/2015, CHA2DS2VASc = 1 (ASA only);  c. 07/2015 Echo: EF 60-65%, nl LA size, nl RV.    Patient Active Problem List   Diagnosis Date Noted  . Tobacco abuse   . Morbid obesity (HCC)   . PAF (paroxysmal atrial fibrillation) (HCC)   . Paroxysmal atrial fibrillation with rapid ventricular response (HCC)   . Bronchitis   . Stress at home   . Smoker   . Sleep disorder breathing   . New onset atrial fibrillation (HCC) 08/11/2015    Past Surgical History  Procedure Laterality Date  . Cesarean section      Current Outpatient Rx  Name  Route  Sig  Dispense  Refill  . aspirin EC 81 MG EC tablet    Oral   Take 1 tablet (81 mg total) by mouth daily.         Marland Kitchen diltiazem (CARDIZEM CD) 120 MG 24 hr capsule   Oral   Take 1 capsule (120 mg total) by mouth daily.   30 capsule   6   . diltiazem (CARDIZEM) 30 MG tablet   Oral   Take 1 tablet (30 mg total) by mouth every 6 (six) hours as needed (for palpitations or afib).   30 tablet   0   . minocycline (MINOCIN,DYNACIN) 100 MG capsule   Oral   Take 100 mg by mouth 2 (two) times daily. For 10 days.           Allergies Review of patient's allergies indicates no known allergies.  Family History  Problem Relation Age of Onset  . Diabetes Mother   . Hypertension Mother   . Stroke Maternal Grandmother   . Stroke Maternal Grandfather   . Hyperlipidemia Father     Social History Social History  Substance Use Topics  . Smoking status: Current Every Day Smoker -- 1.00 packs/day for 31 years    Types: Cigarettes  . Smokeless tobacco: None  . Alcohol Use: 0.0 oz/week    0 Standard drinks or equivalent per week    Review of Systems Constitutional: No fever/chills Eyes: No visual changes. ENT: No sore throat. Cardiovascular: Denies chest pain. Respiratory:  Denies shortness of breath. Gastrointestinal: No abdominal pain.  No nausea, no vomiting.  No diarrhea.  No constipation. Genitourinary: Negative for dysuria. Musculoskeletal: Negative for back pain. Skin:As above Neurological: Negative for headaches, focal weakness or numbness.  10-point ROS otherwise negative.  ____________________________________________   PHYSICAL EXAM:  VITAL SIGNS: ED Triage Vitals  Enc Vitals Group     BP 12/28/15 1208 126/72 mmHg     Pulse Rate 12/28/15 1208 116     Resp 12/28/15 1208 18     Temp 12/28/15 1208 98.1 F (36.7 C)     Temp Source 12/28/15 1208 Oral     SpO2 12/28/15 1208 95 %     Weight 12/28/15 1208 242 lb 1 oz (109.8 kg)     Height --      Head Cir --      Peak Flow --      Pain Score 12/28/15 1209 10     Pain  Loc --      Pain Edu? --      Excl. in GC? --     Constitutional: Alert and oriented. Well appearing and in no acute distress. Eyes: Conjunctivae are normal. PERRL. EOMI. Head: Atraumatic. Nose: No congestion/rhinnorhea. Mouth/Throat: Mucous membranes are moist.  Oropharynx non-erythematous. Neck: No stridor.   Cardiovascular:   Tachycardic with an irregularly irregular rhythm. Grossly normal heart sounds.   Respiratory: Normal respiratory effort.  No retractions. Lungs CTAB. Gastrointestinal: Soft and nontender. No distention.  Musculoskeletal: No lower extremity tenderness nor edema.  No joint effusions. Neurologic:  Normal speech and language. No gross abnormalities observed. Skin:  3 cm x 6 cm area of erythema to the left lower thorax with central fluctuance as well as a mild amount of pus drainage. Tenderness palpation over this lesion. Psychiatric: Mood and affect are normal. Speech and behavior are normal.  ____________________________________________   LABS (all labs ordered are listed, but only abnormal results are displayed)  Labs Reviewed - No data to display ____________________________________________  EKG  ED ECG REPORT I, Quandra Fedorchak,  Teena Irani, the attending physician, personally viewed and interpreted this ECG.   Date: 12/28/2015  EKG Time: 1202  Rate: 135  Rhythm: atrial fibrillation, rate 135  Axis: Normal axis  Intervals:none  ST&T Change: No ST segment elevation or depression. No abnormal T-wave inversion.  ____________________________________________  RADIOLOGY   ____________________________________________   PROCEDURES  INCISION AND DRAINAGE Performed by: Arelia Longest Consent: Verbal consent obtained. Risks and benefits: risks, benefits and alternatives were discussed Type: abscess  Body area: left thoracic wall  Anesthesia: local infiltration  Incision was made with a scalpel.  Local anesthetic: lidocaine 1 % with  epinephrine  Anesthetic total: 4 ml  Complexity: complex Blunt dissection to break up loculations  Drainage: purulent  Drainage amount: 5 MLS   Packing material: 1/4 in iodoform gauze  Patient tolerance: Patient tolerated the procedure well with no immediate complications.     ____________________________________________   INITIAL IMPRESSION / ASSESSMENT AND PLAN / ED COURSE  Pertinent labs & imaging results that were available during my care of the patient were reviewed by me and considered in my medical decision making (see chart for details).  ----------------------------------------- 2:14 PM on 12/28/2015 -----------------------------------------  Patient's heart rate is now 97. Pain relieved after incision and drainage. She still has 3 days of Bactrim left. Will be discharged to home. Nose to follow-up to have the packing removed and for wound check in 2 days and for possible extension  of her antibiotics if needed. Patient now taking her rate control medications and is appropriate for discharge to home. Feels much improved after incision and drainage. Likely A. fib with RVR secondary to pain and noncompliance with her medications. ____________________________________________   FINAL CLINICAL IMPRESSION(S) / ED DIAGNOSES  Atrial fibrillation with rapid ventricular response. Left-sided thoracic abscess to the thoracic wall.    NEW MEDICATIONS STARTED DURING THIS VISIT:  New Prescriptions   No medications on file     Note:  This document was prepared using Dragon voice recognition software and may include unintentional dictation errors.    Myrna Blazeravid Matthew Hadlei Stitt, MD 12/28/15 1415

## 2015-12-28 NOTE — Discharge Instructions (Signed)
Abscess An abscess (boil or furuncle) is an infected area on or under the skin. This area is filled with yellowish-white fluid (pus) and other material (debris). HOME CARE   Only take medicines as told by your doctor.  If you were given antibiotic medicine, take it as directed. Finish the medicine even if you start to feel better.  If gauze is used, follow your doctor's directions for changing the gauze.  To avoid spreading the infection:  Keep your abscess covered with a bandage.  Wash your hands well.  Do not share personal care items, towels, or whirlpools with others.  Avoid skin contact with others.  Keep your skin and clothes clean around the abscess.  Keep all doctor visits as told. GET HELP RIGHT AWAY IF:   You have more pain, puffiness (swelling), or redness in the wound site.  You have more fluid or blood coming from the wound site.  You have muscle aches, chills, or you feel sick.  You have a fever. MAKE SURE YOU:   Understand these instructions.  Will watch your condition.  Will get help right away if you are not doing well or get worse.   This information is not intended to replace advice given to you by your health care provider. Make sure you discuss any questions you have with your health care provider.   Document Released: 01/16/2008 Document Revised: 01/29/2012 Document Reviewed: 10/13/2011 Elsevier Interactive Patient Education 2016 Elsevier Inc.  Atrial Fibrillation Atrial fibrillation is a type of irregular or rapid heartbeat (arrhythmia). In atrial fibrillation, the heart quivers continuously in a chaotic pattern. This occurs when parts of the heart receive disorganized signals that make the heart unable to pump blood normally. This can increase the risk for stroke, heart failure, and other heart-related conditions. There are different types of atrial fibrillation, including:  Paroxysmal atrial fibrillation. This type starts suddenly, and it  usually stops on its own shortly after it starts.  Persistent atrial fibrillation. This type often lasts longer than a week. It may stop on its own or with treatment.  Long-lasting persistent atrial fibrillation. This type lasts longer than 12 months.  Permanent atrial fibrillation. This type does not go away. Talk with your health care provider to learn about the type of atrial fibrillation that you have. CAUSES This condition is caused by some heart-related conditions or procedures, including:  A heart attack.  Coronary artery disease.  Heart failure.  Heart valve conditions.  High blood pressure.  Inflammation of the sac that surrounds the heart (pericarditis).  Heart surgery.  Certain heart rhythm disorders, such as Wolf-Parkinson-White syndrome. Other causes include:  Pneumonia.  Obstructive sleep apnea.  Blockage of an artery in the lungs (pulmonary embolism, or PE).  Lung cancer.  Chronic lung disease.  Thyroid problems, especially if the thyroid is overactive (hyperthyroidism).  Caffeine.  Excessive alcohol use or illegal drug use.  Use of some medicines, including certain decongestants and diet pills. Sometimes, the cause cannot be found. RISK FACTORS This condition is more likely to develop in:  People who are older in age.  People who smoke.  People who have diabetes mellitus.  People who are overweight (obese).  Athletes who exercise vigorously. SYMPTOMS Symptoms of this condition include:  A feeling that your heart is beating rapidly or irregularly.  A feeling of discomfort or pain in your chest.  Shortness of breath.  Sudden light-headedness or weakness.  Getting tired easily during exercise. In some cases, there are no symptoms.  DIAGNOSIS Your health care provider may be able to detect atrial fibrillation when taking your pulse. If detected, this condition may be diagnosed with:  An electrocardiogram (ECG).  A Holter monitor  test that records your heartbeat patterns over a 24-hour period.  Transthoracic echocardiogram (TTE) to evaluate how blood flows through your heart.  Transesophageal echocardiogram (TEE) to view more detailed images of your heart.  A stress test.  Imaging tests, such as a CT scan or chest X-ray.  Blood tests. TREATMENT The main goals of treatment are to prevent blood clots from forming and to keep your heart beating at a normal rate and rhythm. The type of treatment that you receive depends on many factors, such as your underlying medical conditions and how you feel when you are experiencing atrial fibrillation. This condition may be treated with:  Medicine to slow down the heart rate, bring the heart's rhythm back to normal, or prevent clots from forming.  Electrical cardioversion. This is a procedure that resets your heart's rhythm by delivering a controlled, low-energy shock to the heart through your skin.  Different types of ablation, such as catheter ablation, catheter ablation with pacemaker, or surgical ablation. These procedures destroy the heart tissues that send abnormal signals. When the pacemaker is used, it is placed under your skin to help your heart beat in a regular rhythm. HOME CARE INSTRUCTIONS  Take over-the counter and prescription medicines only as told by your health care provider.  If your health care provider prescribed a blood-thinning medicine (anticoagulant), take it exactly as told. Taking too much blood-thinning medicine can cause bleeding. If you do not take enough blood-thinning medicine, you will not have the protection that you need against stroke and other problems.  Do not use tobacco products, including cigarettes, chewing tobacco, and e-cigarettes. If you need help quitting, ask your health care provider.  If you have obstructive sleep apnea, manage your condition as told by your health care provider.  Do not drink alcohol.  Do not drink beverages  that contain caffeine, such as coffee, soda, and tea.  Maintain a healthy weight. Do not use diet pills unless your health care provider approves. Diet pills may make heart problems worse.  Follow diet instructions as told by your health care provider.  Exercise regularly as told by your health care provider.  Keep all follow-up visits as told by your health care provider. This is important. PREVENTION  Avoid drinking beverages that contain caffeine or alcohol.  Avoid certain medicines, especially medicines that are used for breathing problems.  Avoid certain herbs and herbal medicines, such as those that contain ephedra or ginseng.  Do not use illegal drugs, such as cocaine and amphetamines.  Do not smoke.  Manage your high blood pressure. SEEK MEDICAL CARE IF:  You notice a change in the rate, rhythm, or strength of your heartbeat.  You are taking an anticoagulant and you notice increased bruising.  You tire more easily when you exercise or exert yourself. SEEK IMMEDIATE MEDICAL CARE IF:  You have chest pain, abdominal pain, sweating, or weakness.  You feel nauseous.  You notice blood in your vomit, bowel movement, or urine.  You have shortness of breath.  You suddenly have swollen feet and ankles.  You feel dizzy.  You have sudden weakness or numbness of the face, arm, or leg, especially on one side of the body.  You have trouble speaking, trouble understanding, or both (aphasia).  Your face or your eyelid droops on one  side. These symptoms may represent a serious problem that is an emergency. Do not wait to see if the symptoms will go away. Get medical help right away. Call your local emergency services (911 in the [place] Do not drive yourself to the hospital.   This information is not intended to replace advice given to you by your health care provider. Make sure you discuss any questions you have with your health care provider.   Document Released: 07/30/2005  Document Revised: 04/20/2015 Document Reviewed: 11/24/2014 Elsevier Interactive Patient Education [employer]2016 Elsevier Inc.

## 2015-12-28 NOTE — ED Notes (Signed)
Patient given peanut butter and graham crackers and ginger ale prior to discharge.

## 2015-12-28 NOTE — ED Notes (Signed)
Patient [facility] for draining abscess which is on left flank. Patient was sent here by the urgent care for atrial fib. Patient states she has not been taking her medication for a. Fib because she did not think she needed it. Patient states she was first diagnosed with atrial fib in December of 2016. Patient is calm, alert and oriented. Atrial fib with a pulse of 116 is on the monitor.

## 2015-12-28 NOTE — ED Notes (Signed)
Patient tolerated procedure to drain abscess well. Area dressed with abd pad and tegaderm.

## 2016-01-17 ENCOUNTER — Telehealth: Payer: Self-pay | Admitting: Cardiovascular Disease

## 2016-01-17 NOTE — Telephone Encounter (Signed)
Pt is asking if she is allowed to take Fastin (phentermine hydrochloride)  She is trying to make her life healthier and went to bariatric center and they may prescribe her this  She was told to call us and make sure this was okay for her to take.  She has also stopped smoking, last time was 10 days ago.  Please advise.

## 2016-01-18 NOTE — Telephone Encounter (Signed)
There are no absolute contraindications based on her history and current medications, however, tachycardia and palpitations are common and potentially serious side effects associated with phentermine and given her history of Afib, this drug is probably not ideal.

## 2016-01-18 NOTE — Telephone Encounter (Signed)
Let patient know recommendations and potential risks associated with that medication per the NP and she verbalized understanding and had no further questions at this time.

## 2016-02-01 ENCOUNTER — Ambulatory Visit (INDEPENDENT_AMBULATORY_CARE_PROVIDER_SITE_OTHER): Payer: Self-pay | Admitting: Cardiovascular Disease

## 2016-02-01 ENCOUNTER — Encounter: Payer: Self-pay | Admitting: Cardiovascular Disease

## 2016-02-01 VITALS — BP 110/78 | HR 70 | Ht 64.0 in | Wt 235.2 lb

## 2016-02-01 DIAGNOSIS — I480 Paroxysmal atrial fibrillation: Secondary | ICD-10-CM

## 2016-02-01 DIAGNOSIS — F17200 Nicotine dependence, unspecified, uncomplicated: Secondary | ICD-10-CM

## 2016-02-01 DIAGNOSIS — Z720 Tobacco use: Secondary | ICD-10-CM

## 2016-02-01 MED ORDER — DILTIAZEM HCL ER COATED BEADS 120 MG PO CP24: 120.0000 mg | ORAL_CAPSULE | Freq: Every day | ORAL | Status: DC

## 2016-02-01 NOTE — Patient Instructions (Signed)
You are doing well. No medication changes were made.  Please call us if you have new issues that need to be addressed before your next appt.  Your physician wants you to follow-up in: 6 months.  You will receive a reminder letter in the mail two months in advance. If you don't receive a letter, please call our office to schedule the follow-up appointment.   

## 2016-02-01 NOTE — Progress Notes (Signed)
Patient ID: Dominique Sullivan, female   DOB: 1970-12-06, 45 y.o.   MRN: [id number] Cardiology Office Note  Date:  02/01/2016   ID:  Dominique Sullivan, DOB 11/06/1970, MRN [id number]  PCP:  No primary care provider on file.   Chief Complaint  Patient presents with  . other    3 month f/u. Meds reviewed verbally with pt.    HPI:  45 year old female with a prior history of palpitations , obesity, and tobacco abuse, admitted to [facility] December 2016 with pneumonia,  found to be in atrial fibrillation, converted with  diltiazem  She presents today for follow-up of her atrial fibrillation  In follow-up today, she reports having a boil left flank May 2017, was not taking her diltiazem at the time, woke up in atrial fibrillation, went to urgent care, transferred to the emergency room, found to be in atrial fibrillation. Had lancing of her boil. Atrial fibrillation by the end of the day  Since then she denies having any further episodes   significant weight gain since she stopped smoking may 27th 2017. Would like to take weight loss supplements  Otherwise reports that she feels well  EKG on today's visit shows normal sinus rhythm with rate 70 bpm, no significant ST or T-wave changes  PMH:   has a past medical history of Morbid obesity (HCC); Tobacco abuse; and PAF (paroxysmal atrial fibrillation) (HCC).  PSH:    Past Surgical History  Procedure Laterality Date  . Cesarean section      Current Outpatient Prescriptions  Medication Sig Dispense Refill  . aspirin EC 81 MG EC tablet Take 1 tablet (81 mg total) by mouth daily.    Marland Kitchen diltiazem (CARDIZEM CD) 120 MG 24 hr capsule Take 1 capsule (120 mg total) by mouth daily. 30 capsule 11  . diltiazem (CARDIZEM) 30 MG tablet Take 1 tablet (30 mg total) by mouth every 6 (six) hours as needed (for palpitations or afib). 30 tablet 0   No current facility-administered medications for this visit.     Allergies:   Review of patient's  allergies indicates no known allergies.   Social History:  The patient  reports that she quit smoking about 3 weeks ago. Her smoking use included Cigarettes. She has a 31 pack-year smoking history. She does not have any smokeless tobacco history on file. She reports that she drinks alcohol. She reports that she does not use illicit drugs.   Family History:   family history includes Diabetes in her mother; Hyperlipidemia in her father; Hypertension in her mother; Stroke in her maternal grandfather and maternal grandmother.    Review of Systems: Review of Systems  Constitutional: Negative.   Respiratory: Negative.   Cardiovascular: Negative.   Gastrointestinal: Negative.   Musculoskeletal: Negative.   Neurological: Negative.   Psychiatric/Behavioral: Negative.   All other systems reviewed and are negative.    PHYSICAL EXAM: VS:  BP 110/78 mmHg  Pulse 70  Ht  (1.626 m)  Wt 235 lb 4 oz (106.709 kg)  BMI 40.36 kg/m2  LMP 02/09/2014 , BMI Body mass index is 40.36 kg/(m^2). GEN: Well nourished, well developed, in no acute distress, obese HEENT: normal Neck: no JVD, carotid bruits, or masses Cardiac: RRR; no murmurs, rubs, or gallops,no edema  Respiratory:  clear to auscultation bilaterally, normal work of breathing GI: soft, nontender, nondistended, + BS MS: no deformity or atrophy Skin: warm and dry, no rash Neuro:  Strength and sensation are intact Psych: euthymic mood, full  affect    Recent Labs: 08/11/2015: BUN 13; Creatinine, Ser 0.73; Hemoglobin 15.5; Platelets 202; Potassium 3.8; Sodium 141; TSH 2.469    Lipid Panel No results found for: CHOL, HDL, LDLCALC, TRIG    Wt Readings from Last 3 Encounters:  02/01/16 235 lb 4 oz (106.709 kg)  12/28/15 242 lb 1 oz (109.8 kg)  08/26/15 232 lb 12.8 oz (105.597 kg)       ASSESSMENT AND PLAN:  PAF (paroxysmal atrial fibrillation) (HCC) - Plan: EKG 12-Lead Episode of atrial fibrillation December 2016, repeat in May  2017 Self-limiting, she was symptomatic, lasting less than 24 hours Recommended that she stay on her diltiazem CD Suggested she has frequent episodes that she call our office May benefit from antiarrhythmics  Smoker Stopped smoking one month ago She does not want Chantix  Morbid obesity due to excess calories (HCC) Recommended it was okay to try weight loss supplements as long as she stayed on her diltiazem for rhythm control. Recommended strict diet changes, exercise program   Disposition:   F/U  6 months   Orders Placed This Encounter  Procedures  . EKG 12-Lead    Total encounter time more than 15 minutes  Greater than 50% was spent in counseling and coordination of care with the patient   Signed, Dossie Arbourim Mashelle Busick, M.D., Ph.D. 02/01/2016  [facility], [place] [phone]

## 2017-04-20 ENCOUNTER — Telehealth: Payer: Self-pay | Admitting: Cardiology

## 2017-04-20 ENCOUNTER — Other Ambulatory Visit: Payer: Self-pay | Admitting: Cardiology

## 2017-04-20 MED ORDER — DILTIAZEM HCL ER COATED BEADS 120 MG PO CP24: 120.0000 mg | ORAL_CAPSULE | Freq: Every day | ORAL | 1 refills | Status: DC

## 2017-04-20 NOTE — Telephone Encounter (Signed)
Back in a fib refilled Dilt CD 120 mg with plan she will go to er if not converteed in 2-3 hours after taking.

## 2017-10-16 ENCOUNTER — Inpatient Hospital Stay: Admit: 2017-10-16 | Payer: Self-pay | Admitting: Surgery

## 2017-10-16 ENCOUNTER — Observation Stay
Admission: EM | Admit: 2017-10-16 | Discharge: 2017-10-17 | Disposition: A | Discharge status: Home / Self Care | Payer: Self-pay | Attending: Surgery | Admitting: Surgery

## 2017-10-16 ENCOUNTER — Emergency Department: Payer: Self-pay | Admitting: Anesthesiology

## 2017-10-16 ENCOUNTER — Other Ambulatory Visit: Payer: Self-pay

## 2017-10-16 ENCOUNTER — Encounter
Admission: EM | Disposition: A | Discharge status: Home / Self Care | Payer: Self-pay | Source: Home / Self Care | Attending: Emergency Medicine

## 2017-10-16 ENCOUNTER — Emergency Department: Payer: Self-pay

## 2017-10-16 ENCOUNTER — Encounter: Payer: Self-pay | Admitting: *Deleted

## 2017-10-16 DIAGNOSIS — F17210 Nicotine dependence, cigarettes, uncomplicated: Secondary | ICD-10-CM | POA: Insufficient documentation

## 2017-10-16 DIAGNOSIS — Z6839 Body mass index (BMI) 39.0-39.9, adult: Secondary | ICD-10-CM | POA: Insufficient documentation

## 2017-10-16 DIAGNOSIS — Z7982 Long term (current) use of aspirin: Secondary | ICD-10-CM | POA: Insufficient documentation

## 2017-10-16 DIAGNOSIS — Z79899 Other long term (current) drug therapy: Secondary | ICD-10-CM | POA: Insufficient documentation

## 2017-10-16 DIAGNOSIS — R1030 Lower abdominal pain, unspecified: Secondary | ICD-10-CM | POA: Insufficient documentation

## 2017-10-16 DIAGNOSIS — Z9114 Patient's other noncompliance with medication regimen: Secondary | ICD-10-CM | POA: Insufficient documentation

## 2017-10-16 DIAGNOSIS — K3580 Unspecified acute appendicitis: Principal | ICD-10-CM | POA: Diagnosis present

## 2017-10-16 DIAGNOSIS — I480 Paroxysmal atrial fibrillation: Secondary | ICD-10-CM | POA: Insufficient documentation

## 2017-10-16 HISTORY — PX: LAPAROSCOPIC APPENDECTOMY: SHX408

## 2017-10-16 LAB — URINALYSIS, COMPLETE (UACMP) WITH MICROSCOPIC
BACTERIA UA: NONE SEEN
Bilirubin Urine: NEGATIVE
Glucose, UA: NEGATIVE mg/dL
Hgb urine dipstick: NEGATIVE
Ketones, ur: NEGATIVE mg/dL
LEUKOCYTES UA: NEGATIVE
Nitrite: NEGATIVE
PROTEIN: NEGATIVE mg/dL
Specific Gravity, Urine: 1.015 (ref 1.005–1.030)
WBC, UA: NONE SEEN WBC/hpf (ref 0–5)
pH: 8 (ref 5.0–8.0)

## 2017-10-16 LAB — PREGNANCY, URINE: PREG TEST UR: NEGATIVE

## 2017-10-16 LAB — CBC
HEMATOCRIT: 41.5 % (ref 35.0–47.0)
Hemoglobin: 13.6 g/dL (ref 12.0–16.0)
MCH: 30 pg (ref 26.0–34.0)
MCHC: 32.8 g/dL (ref 32.0–36.0)
MCV: 91.3 fL (ref 80.0–100.0)
PLATELETS: 187 10*3/uL (ref 150–440)
RBC: 4.54 MIL/uL (ref 3.80–5.20)
RDW: 14.1 % (ref 11.5–14.5)
WBC: 12.8 10*3/uL — ABNORMAL HIGH (ref 3.6–11.0)

## 2017-10-16 LAB — COMPREHENSIVE METABOLIC PANEL
ALBUMIN: 4 g/dL (ref 3.5–5.0)
ALT: 19 U/L (ref 14–54)
AST: 21 U/L (ref 15–41)
Alkaline Phosphatase: 63 U/L (ref 38–126)
Anion gap: 8 (ref 5–15)
BUN: 13 mg/dL (ref 6–20)
CHLORIDE: 108 mmol/L (ref 101–111)
CO2: 26 mmol/L (ref 22–32)
CREATININE: 0.62 mg/dL (ref 0.44–1.00)
Calcium: 8.7 mg/dL — ABNORMAL LOW (ref 8.9–10.3)
GFR calc Af Amer: >60 mL/min (ref >60)
GFR calc non Af Amer: >60 mL/min (ref >60)
GLUCOSE: 104 mg/dL — AB (ref 65–99)
POTASSIUM: 3.9 mmol/L (ref 3.5–5.1)
Sodium: 142 mmol/L (ref 135–145)
Total Bilirubin: 0.6 mg/dL (ref 0.3–1.2)
Total Protein: 7.1 g/dL (ref 6.5–8.1)

## 2017-10-16 LAB — LIPASE, BLOOD: LIPASE: 32 U/L (ref 11–51)

## 2017-10-16 SURGERY — APPENDECTOMY, LAPAROSCOPIC
Anesthesia: General | Site: Abdomen | Wound class: Clean Contaminated

## 2017-10-16 MED ORDER — FENTANYL CITRATE (PF) 100 MCG/2ML IJ SOLN
25.0000 ug | INTRAMUSCULAR | Status: DC | PRN
  Administered 2017-10-16 (×4): 25 ug via INTRAVENOUS

## 2017-10-16 MED ORDER — MIDAZOLAM HCL 2 MG/2ML IJ SOLN
INTRAMUSCULAR | Status: AC
  Filled 2017-10-16: qty 2

## 2017-10-16 MED ORDER — HEPARIN SODIUM (PORCINE) 5000 UNIT/ML IJ SOLN
5000.0000 [IU] | Freq: Three times a day (TID) | INTRAMUSCULAR | Status: DC
  Administered 2017-10-17: 5000 [IU] via SUBCUTANEOUS
  Filled 2017-10-16: qty 1

## 2017-10-16 MED ORDER — MORPHINE SULFATE (PF) 4 MG/ML IV SOLN
4.0000 mg | Freq: Once | INTRAVENOUS | Status: AC
  Administered 2017-10-16: 4 mg via INTRAVENOUS

## 2017-10-16 MED ORDER — BUPIVACAINE-EPINEPHRINE (PF) 0.25% -1:200000 IJ SOLN
INTRAMUSCULAR | Status: DC | PRN
  Administered 2017-10-16: 30 mL via PERINEURAL

## 2017-10-16 MED ORDER — PIPERACILLIN-TAZOBACTAM 3.375 G IVPB 30 MIN
3.3750 g | Freq: Once | INTRAVENOUS | Status: AC
  Administered 2017-10-16: 3.375 g via INTRAVENOUS
  Filled 2017-10-16: qty 50

## 2017-10-16 MED ORDER — IOPAMIDOL (ISOVUE-300) INJECTION 61%
100.0000 mL | Freq: Once | INTRAVENOUS | Status: AC | PRN
  Administered 2017-10-16: 100 mL via INTRAVENOUS
  Filled 2017-10-16: qty 100

## 2017-10-16 MED ORDER — SODIUM CHLORIDE 0.9 % IV BOLUS (SEPSIS)
1000.0000 mL | Freq: Once | INTRAVENOUS | Status: AC
  Administered 2017-10-16: 1000 mL via INTRAVENOUS

## 2017-10-16 MED ORDER — ONDANSETRON HCL 4 MG/2ML IJ SOLN
4.0000 mg | Freq: Once | INTRAMUSCULAR | Status: AC
  Administered 2017-10-16: 4 mg via INTRAVENOUS
  Filled 2017-10-16: qty 2

## 2017-10-16 MED ORDER — ROCURONIUM BROMIDE 100 MG/10ML IV SOLN
INTRAVENOUS | Status: DC | PRN
  Administered 2017-10-16: 30 mg via INTRAVENOUS

## 2017-10-16 MED ORDER — ROCURONIUM BROMIDE 50 MG/5ML IV SOLN
INTRAVENOUS | Status: AC
  Filled 2017-10-16: qty 1

## 2017-10-16 MED ORDER — MORPHINE SULFATE (PF) 4 MG/ML IV SOLN: 2.0000 mg | INTRAVENOUS | Status: DC | PRN

## 2017-10-16 MED ORDER — SUCCINYLCHOLINE CHLORIDE 20 MG/ML IJ SOLN
INTRAMUSCULAR | Status: AC
Start: 2017-10-16 — End: ?
  Filled 2017-10-16: qty 1

## 2017-10-16 MED ORDER — MORPHINE SULFATE (PF) 4 MG/ML IV SOLN
4.0000 mg | Freq: Once | INTRAVENOUS | Status: AC
  Administered 2017-10-16: 4 mg via INTRAVENOUS
  Filled 2017-10-16: qty 1

## 2017-10-16 MED ORDER — DILTIAZEM HCL ER COATED BEADS 120 MG PO CP24
120.0000 mg | ORAL_CAPSULE | Freq: Every day | ORAL | Status: DC
  Administered 2017-10-16 – 2017-10-17 (×2): 120 mg via ORAL
  Filled 2017-10-16 (×2): qty 1

## 2017-10-16 MED ORDER — SUCCINYLCHOLINE CHLORIDE 20 MG/ML IJ SOLN
INTRAMUSCULAR | Status: DC | PRN
  Administered 2017-10-16: 100 mg via INTRAVENOUS

## 2017-10-16 MED ORDER — SUGAMMADEX SODIUM 200 MG/2ML IV SOLN
INTRAVENOUS | Status: DC | PRN
  Administered 2017-10-16: 208.6 mg via INTRAVENOUS

## 2017-10-16 MED ORDER — FENTANYL CITRATE (PF) 100 MCG/2ML IJ SOLN
INTRAMUSCULAR | Status: AC
  Administered 2017-10-16: 25 ug via INTRAVENOUS
  Filled 2017-10-16: qty 2

## 2017-10-16 MED ORDER — FENTANYL CITRATE (PF) 100 MCG/2ML IJ SOLN
INTRAMUSCULAR | Status: DC | PRN
  Administered 2017-10-16 (×2): 50 ug via INTRAVENOUS

## 2017-10-16 MED ORDER — PROPOFOL 10 MG/ML IV BOLUS
INTRAVENOUS | Status: AC
  Filled 2017-10-16: qty 20

## 2017-10-16 MED ORDER — EPINEPHRINE PF 1 MG/ML IJ SOLN
INTRAMUSCULAR | Status: AC
  Filled 2017-10-16: qty 1

## 2017-10-16 MED ORDER — MORPHINE SULFATE (PF) 4 MG/ML IV SOLN
INTRAVENOUS | Status: AC
  Filled 2017-10-16: qty 1

## 2017-10-16 MED ORDER — ONDANSETRON HCL 4 MG PO TABS: 4.0000 mg | ORAL_TABLET | Freq: Four times a day (QID) | ORAL | Status: DC | PRN

## 2017-10-16 MED ORDER — DEXTROSE-NACL 5-0.9 % IV SOLN
INTRAVENOUS | Status: DC
  Administered 2017-10-16: via INTRAVENOUS

## 2017-10-16 MED ORDER — ONDANSETRON HCL 4 MG/2ML IJ SOLN
4.0000 mg | Freq: Four times a day (QID) | INTRAMUSCULAR | Status: DC | PRN
  Administered 2017-10-17: 4 mg via INTRAVENOUS
  Filled 2017-10-16: qty 2

## 2017-10-16 MED ORDER — FENTANYL CITRATE (PF) 100 MCG/2ML IJ SOLN
INTRAMUSCULAR | Status: AC
  Filled 2017-10-16: qty 2

## 2017-10-16 MED ORDER — PROPOFOL 10 MG/ML IV BOLUS
INTRAVENOUS | Status: DC | PRN
  Administered 2017-10-16: 160 mg via INTRAVENOUS

## 2017-10-16 MED ORDER — ONDANSETRON HCL 4 MG/2ML IJ SOLN
INTRAMUSCULAR | Status: AC
  Filled 2017-10-16: qty 2

## 2017-10-16 MED ORDER — LACTATED RINGERS IV SOLN
INTRAVENOUS | Status: DC | PRN
  Administered 2017-10-16: 20:00:00 via INTRAVENOUS

## 2017-10-16 MED ORDER — ONDANSETRON HCL 4 MG/2ML IJ SOLN: 4.0000 mg | Freq: Once | INTRAMUSCULAR | Status: DC | PRN

## 2017-10-16 MED ORDER — ALBUTEROL SULFATE HFA 108 (90 BASE) MCG/ACT IN AERS
INHALATION_SPRAY | RESPIRATORY_TRACT | Status: DC | PRN
  Administered 2017-10-16: 2 via RESPIRATORY_TRACT

## 2017-10-16 MED ORDER — DEXAMETHASONE SODIUM PHOSPHATE 10 MG/ML IJ SOLN
INTRAMUSCULAR | Status: AC
  Filled 2017-10-16: qty 1

## 2017-10-16 MED ORDER — ASPIRIN EC 81 MG PO TBEC
81.0000 mg | DELAYED_RELEASE_TABLET | Freq: Every day | ORAL | Status: DC
  Administered 2017-10-17: 81 mg via ORAL
  Filled 2017-10-16: qty 1

## 2017-10-16 MED ORDER — HYDROCODONE-ACETAMINOPHEN 5-325 MG PO TABS
1.0000 | ORAL_TABLET | ORAL | Status: DC | PRN
  Administered 2017-10-16: 1 via ORAL
  Administered 2017-10-17: 2 via ORAL
  Administered 2017-10-17 (×2): 1 via ORAL
  Filled 2017-10-16 (×2): qty 1
  Filled 2017-10-16 (×2): qty 2

## 2017-10-16 MED ORDER — ONDANSETRON HCL 4 MG/2ML IJ SOLN
INTRAMUSCULAR | Status: DC | PRN
  Administered 2017-10-16: 4 mg via INTRAVENOUS

## 2017-10-16 MED ORDER — MIDAZOLAM HCL 2 MG/2ML IJ SOLN
INTRAMUSCULAR | Status: DC | PRN
  Administered 2017-10-16: 2 mg via INTRAVENOUS

## 2017-10-16 MED ORDER — DEXAMETHASONE SODIUM PHOSPHATE 10 MG/ML IJ SOLN
INTRAMUSCULAR | Status: DC | PRN
  Administered 2017-10-16: 10 mg via INTRAVENOUS

## 2017-10-16 MED ORDER — LIDOCAINE HCL (PF) 2 % IJ SOLN
INTRAMUSCULAR | Status: AC
  Filled 2017-10-16: qty 10

## 2017-10-16 MED ORDER — BUPIVACAINE HCL (PF) 0.25 % IJ SOLN
INTRAMUSCULAR | Status: AC
  Filled 2017-10-16: qty 30

## 2017-10-16 MED ORDER — IOPAMIDOL (ISOVUE-300) INJECTION 61%
30.0000 mL | Freq: Once | INTRAVENOUS | Status: AC | PRN
  Administered 2017-10-16: 30 mL via ORAL
  Filled 2017-10-16: qty 30

## 2017-10-16 MED ORDER — LIDOCAINE HCL (CARDIAC) 20 MG/ML IV SOLN
INTRAVENOUS | Status: DC | PRN
  Administered 2017-10-16: 30 mg via INTRAVENOUS

## 2017-10-16 SURGICAL SUPPLY — 41 items
ADHESIVE MASTISOL STRL (MISCELLANEOUS) ×3 IMPLANT
APPLIER CLIP ROT 10 11.4 M/L (STAPLE) ×3
BLADE SURG SZ11 CARB STEEL (BLADE) ×3 IMPLANT
CANISTER SUCT 3000ML PPV (MISCELLANEOUS) ×3 IMPLANT
CHLORAPREP W/TINT 26ML (MISCELLANEOUS) ×3 IMPLANT
CLIP APPLIE ROT 10 11.4 M/L (STAPLE) ×1 IMPLANT
CLOSURE WOUND 1/2 X4 (GAUZE/BANDAGES/DRESSINGS) ×1
CUTTER FLEX LINEAR 45M (STAPLE) ×3 IMPLANT
DEVICE TROCAR PUNCTURE CLOSURE (ENDOMECHANICALS) ×3 IMPLANT
ELECT REM PT RETURN 9FT ADLT (ELECTROSURGICAL) ×3
ELECTRODE REM PT RTRN 9FT ADLT (ELECTROSURGICAL) ×1 IMPLANT
GLOVE BIO SURGEON STRL SZ8 (GLOVE) ×15 IMPLANT
GOWN STRL REUS W/ TWL LRG LVL3 (GOWN DISPOSABLE) ×2 IMPLANT
GOWN STRL REUS W/TWL LRG LVL3 (GOWN DISPOSABLE) ×4
IRRIGATION STRYKERFLOW (MISCELLANEOUS) ×1 IMPLANT
IRRIGATOR STRYKERFLOW (MISCELLANEOUS) ×3
KIT TURNOVER KIT A (KITS) ×3 IMPLANT
LABEL OR SOLS (LABEL) ×3 IMPLANT
NEEDLE HYPO 22GX1.5 SAFETY (NEEDLE) ×3 IMPLANT
NEEDLE VERESS 14GA 120MM (NEEDLE) ×3 IMPLANT
NS IRRIG 500ML POUR BTL (IV SOLUTION) ×3 IMPLANT
PACK LAP CHOLECYSTECTOMY (MISCELLANEOUS) ×3 IMPLANT
POUCH SPECIMEN RETRIEVAL 10MM (ENDOMECHANICALS) ×3 IMPLANT
RELOAD 45 VASCULAR/THIN (ENDOMECHANICALS) ×3 IMPLANT
RELOAD STAPLE TA45 3.5 REG BLU (ENDOMECHANICALS) ×3 IMPLANT
SCISSORS METZENBAUM CVD 33 (INSTRUMENTS) IMPLANT
SLEEVE ENDOPATH XCEL 5M (ENDOMECHANICALS) ×3 IMPLANT
SOL .9 NS 3000ML IRR  AL (IV SOLUTION) ×2
SOL .9 NS 3000ML IRR UROMATIC (IV SOLUTION) ×1 IMPLANT
SPONGE GAUZE 2X2 8PLY STER LF (GAUZE/BANDAGES/DRESSINGS) ×3
SPONGE GAUZE 2X2 8PLY STRL LF (GAUZE/BANDAGES/DRESSINGS) ×6 IMPLANT
SPONGE LAP 18X18 5 PK (GAUZE/BANDAGES/DRESSINGS) ×3 IMPLANT
STRIP CLOSURE SKIN 1/2X4 (GAUZE/BANDAGES/DRESSINGS) ×2 IMPLANT
SUT MNCRL 4-0 (SUTURE) ×2
SUT MNCRL 4-0 27XMFL (SUTURE) ×1
SUT VICRYL 0 TIES 12 18 (SUTURE) ×3 IMPLANT
SUTURE MNCRL 4-0 27XMF (SUTURE) ×1 IMPLANT
TRAY FOLEY W/METER SILVER 16FR (SET/KITS/TRAYS/PACK) ×3 IMPLANT
TROCAR XCEL 12X100 BLDLESS (ENDOMECHANICALS) ×3 IMPLANT
TROCAR XCEL NON-BLD 5MMX100MML (ENDOMECHANICALS) ×3 IMPLANT
TUBING INSUFFLATION (TUBING) ×3 IMPLANT

## 2017-10-16 NOTE — Transfer of Care (Signed)
Immediate Anesthesia Transfer of Care Note  Patient: Dominique Sullivan  Procedure(s) Performed: APPENDECTOMY LAPAROSCOPIC (N/A Abdomen)  Patient Location: PACU  Anesthesia Type:General  Level of Consciousness: awake, alert , oriented and patient cooperative  Airway & Oxygen Therapy: Patient Spontanous Breathing and Patient connected to face mask oxygen  Post-op Assessment: Report given to RN and Post -op Vital signs reviewed and stable  Post vital signs: Reviewed and stable  Last Vitals:  Vitals:   10/16/17 1842 10/16/17 2037  BP: 130/80 (!) 158/81  Pulse: (!) 103 (!) 123  Resp: 16 20  Temp: 37.2 C 37.1 C  SpO2: 94% 98%    Last Pain:  Vitals:   10/16/17 2037  TempSrc:   PainSc: 0-No pain         Complications: No apparent anesthesia complications

## 2017-10-16 NOTE — ED Triage Notes (Signed)
Pt has low abd pain and back pain.  Pt denies vag bleeding or discharge.   Pt reports nausea.  Pt alert.

## 2017-10-16 NOTE — ED Notes (Signed)
Pt was taken to the OR.

## 2017-10-16 NOTE — ED Notes (Signed)
Called lab to add urine pregnancy to urine sample that was sent down

## 2017-10-16 NOTE — Anesthesia Procedure Notes (Signed)
Procedure Name: Intubation Date/Time: 10/16/2017 7:42 PM Performed by: Lendon Colonel, CRNA Pre-anesthesia Checklist: Patient identified, Patient being monitored, Timeout performed, Emergency Drugs available and Suction available Patient Re-evaluated:Patient Re-evaluated prior to induction Oxygen Delivery Method: Circle system utilized Preoxygenation: Pre-oxygenation with 100% oxygen Induction Type: IV induction, Rapid sequence and Cricoid Pressure applied Laryngoscope Size: Mac and 3 Grade View: Grade II Tube type: Oral Tube size: 7.0 mm Number of attempts: 1 Airway Equipment and Method: Stylet Placement Confirmation: ETT inserted through vocal cords under direct vision,  positive ETCO2 and breath sounds checked- equal and bilateral Secured at: 21 cm Tube secured with: Tape Dental Injury: Teeth and Oropharynx as per pre-operative assessment

## 2017-10-16 NOTE — ED Notes (Signed)
Patient transported to CT 

## 2017-10-16 NOTE — ED Provider Notes (Addendum)
[facility] Emergency Department Provider Note  Time seen: 4:40 PM  I have reviewed the triage vital signs and the nursing notes.   HISTORY  Chief Complaint Abdominal Pain    HPI ANAHLIA ISEMINGER is a 47 y.o. female with a past medical history of obesity, approximately atrial fibrillation, on aspirin therapy, presents to the emergency department for abdominal pain.  According to the patient with a past 2 days she has had progressively worsening lower abdominal pain along with diarrhea.  States occasional nausea but denies any vomiting.  Denies any known fever.  Denies dysuria but states a pressure sensation in her lower abdomen/pelvis.  Describes her pain as moderate, worse with movement.  Dull aching sensation.   Past Medical History:  Diagnosis Date  . Morbid obesity (HCC)   . PAF (paroxysmal atrial fibrillation) (HCC)    a. patient reported tachy-palpitations in 2010 (no documented Afib at that time as she did not seek medical care); b. documented Afib 07/2015, CHA2DS2VASc = 1 (ASA only);  c. 07/2015 Echo: EF 60-65%, nl LA size, nl RV.  . Tobacco abuse     Patient Active Problem List   Diagnosis Date Noted  . Tobacco abuse   . Morbid obesity (HCC)   . PAF (paroxysmal atrial fibrillation) (HCC)   . Bronchitis   . Stress at home   . Smoker   . Sleep disorder breathing   . New onset atrial fibrillation (HCC) 08/11/2015    Past Surgical History:  Procedure Laterality Date  . CESAREAN SECTION      Prior to Admission medications   Medication Sig Start Date End Date Taking? Authorizing Provider  aspirin EC 81 MG EC tablet Take 1 tablet (81 mg total) by mouth daily. 08/12/15   Alford Highland, MD  diltiazem (CARDIZEM CD) 120 MG 24 hr capsule Take 1 capsule (120 mg total) by mouth daily. 02/01/16   Antonieta Iba, MD  diltiazem (CARDIZEM CD) 120 MG 24 hr capsule Take 1 capsule (120 mg total) by mouth daily. 04/20/17 04/20/18  Leone Brand, NP   diltiazem (CARDIZEM) 30 MG tablet Take 1 tablet (30 mg total) by mouth every 6 (six) hours as needed (for palpitations or afib). 08/16/15   Antonieta Iba, MD    No Known Allergies  Family History  Problem Relation Age of Onset  . Diabetes Mother   . Hypertension Mother   . Stroke Maternal Grandmother   . Stroke Maternal Grandfather   . Hyperlipidemia Father     Social History Social History   Tobacco Use  . Smoking status: Current Every Day Smoker    Packs/day: 1.00    Years: 31.00    Pack years: 31.00    Types: Cigarettes    Last attempt to quit: 01/07/2016    Years since quitting: 1.7  . Smokeless tobacco: Never Used  Substance Use Topics  . Alcohol use: Yes    Alcohol/week: 0.0 oz  . Drug use: No    Comment: remote MJ use as teenager - denies any other illegal drug abuse    Review of Systems Constitutional: Negative for fever. Eyes: Negative for visual complaints ENT: Negative for recent illness/congestion Cardiovascular: Negative for chest pain. Respiratory: Negative for shortness of breath. Gastrointestinal: Lower abdominal pain.  Nausea.  Diarrhea.  Negative for vomiting. Genitourinary: Urinary pressure but no dysuria.  No hematuria. Musculoskeletal: Negative for musculoskeletal complaints Skin: Negative for skin complaints  Neurological: Negative for headache All other ROS negative  ____________________________________________   PHYSICAL EXAM:  VITAL SIGNS: ED Triage Vitals  Enc Vitals Group     BP 10/16/17 1516 (!) 156/87     Pulse Rate 10/16/17 1516 (!) 109     Resp 10/16/17 1516 20     Temp 10/16/17 1516 98.6 F (37 C)     Temp Source 10/16/17 1516 Oral     SpO2 10/16/17 1516 99 %     Weight 10/16/17 1513 230 lb (104.3 kg)     Height 10/16/17 1513 5\' 4"  (1.626 m)     Head Circumference --      Peak Flow --      Pain Score 10/16/17 1513 6     Pain Loc --      Pain Edu? --      Excl. in GC? --    Constitutional: Alert and oriented.   Mild distress due to lower abdominal pain.  Holding lower abdomen. Eyes: Normal exam ENT   Head: Normocephalic and atraumatic.   Mouth/Throat: Mucous membranes are moist. Cardiovascular: Normal rate, regular rhythm. No murmur Respiratory: Normal respiratory effort without tachypnea nor retractions. Breath sounds are clear  Gastrointestinal: Soft, moderate suprapubic and left lower quadrant abdominal tenderness to palpation.  No rebound or guarding.  No distention. Musculoskeletal: Nontender with normal range of motion in all extremities. Neurologic:  Normal speech and language. No gross focal neurologic deficits Skin:  Skin is warm, dry and intact.  Psychiatric: Mood and affect are normal.   ____________________________________________  RADIOLOGY  CT consistent with appendicitis.  ____________________________________________   INITIAL IMPRESSION / ASSESSMENT AND PLAN / ED COURSE  Pertinent labs & imaging results that were available during my care of the patient were reviewed by me and considered in my medical decision making (see chart for details).  She presents emergency department for lower abdominal pain over the past 2-3 days.  Now with diarrhea.  Differential would include urinary tract infection, gastroenteritis, colitis, diverticulitis, other intra-abdominal pathology.  Patient's labs overall reassuring besides a moderate leukocytosis 12,800.  Urinalysis normal.  We will proceed with a CT scan of the abdomen/pelvis to further evaluate.  We will treat with morphine, Zofran and IV fluids.  Patient agreeable to this plan of care.  CT consistent with appendicitis.  Discussed with general surgery.  We will start the patient on IV Zosyn.  Surgery will be down to see the patient shortly.  EKG reviewed and interpreted by myself shows sinus tachycardia 105 bpm with a narrow QRS, normal axis, normal intervals, no concerning ST changes.   ____________________________________________   FINAL CLINICAL IMPRESSION(S) / ED DIAGNOSES  Lower abdominal pain Acute appendicitis   Minna AntisPaduchowski, Pilot Prindle, MD 10/16/17 Karren [id number]    Minna AntisPaduchowski, Madoc Holquin, MD 10/16/17 [phone]

## 2017-10-16 NOTE — Progress Notes (Signed)
Preoperative Review   Patient is met in the preoperative holding area. The history is reviewed in the chart and with the patient. I personally reviewed the options and rationale as well as the risks of this procedure that have been previously discussed with the patient. All questions asked by the patient and/or family were answered to their satisfaction.  Patient agrees to proceed with this procedure at this time.  Dlynn Ranes E Ruthia Person M.D. FACS  

## 2017-10-16 NOTE — Anesthesia Post-op Follow-up Note (Signed)
Anesthesia QCDR form completed.        

## 2017-10-16 NOTE — H&P (Signed)
Patient ID: Dominique Sullivan, female   DOB: Aug 29, 1970, 47 y.o.   MRN: [id number]  HPI Dominique Sullivan is a 47 y.o. female seen in the emergency room with lower abdominal pain that started last night.  She reports that the pain initially was intermittent and moderate in intensity but now it is constant and moderate to severe.  She reports sharp pain in the suprapubic area and right lower quadrant.  Pain is worsening with movement.  No specific alleviating factors.  No fevers no chills.  Some decreased appetite.  She does have some diarrhea and some nausea. CT scan personally reviewed there is evidence of appendicitis with periappendiceal stranding.  No perforation or abscess Creatinine is normal and white count is slightly elevated He smokes a pack and a half a day and has a history of A. fib but she is noncompliant with medication. She is able to perform more than 4 metastases of activity without any shortness of breath or chest pain She had some pop soda about 3 hours ago and last meal was around 11 am. Previous C-sections x2 via midline Laparotomy  HPI  Past Medical History:  Diagnosis Date  . Morbid obesity (HCC)   . PAF (paroxysmal atrial fibrillation) (HCC)    a. patient reported tachy-palpitations in 2010 (no documented Afib at that time as she did not seek medical care); b. documented Afib 07/2015, CHA2DS2VASc = 1 (ASA only);  c. 07/2015 Echo: EF 60-65%, nl LA size, nl RV.  . Tobacco abuse     Past Surgical History:  Procedure Laterality Date  . CESAREAN SECTION      Family History  Problem Relation Age of Onset  . Diabetes Mother   . Hypertension Mother   . Stroke Maternal Grandmother   . Stroke Maternal Grandfather   . Hyperlipidemia Father     Social History Social History   Tobacco Use  . Smoking status: Current Every Day Smoker    Packs/day: 1.00    Years: 31.00    Pack years: 31.00    Types: Cigarettes    Last attempt to quit: 01/07/2016    Years since  quitting: 1.7  . Smokeless tobacco: Never Used  Substance Use Topics  . Alcohol use: Yes    Alcohol/week: 0.0 oz  . Drug use: No    Comment: remote MJ use as teenager - denies any other illegal drug abuse    No Known Allergies  Current Facility-Administered Medications  Medication Dose Route Frequency Provider Last Rate Last Dose  . sodium chloride 0.9 % bolus 1,000 mL  1,000 mL Intravenous Once Zeena Starkel, Merri Ray, MD       Current Outpatient Medications  Medication Sig Dispense Refill  . aspirin EC 81 MG EC tablet Take 1 tablet (81 mg total) by mouth daily.    Marland Kitchen diltiazem (CARDIZEM CD) 120 MG 24 hr capsule Take 1 capsule (120 mg total) by mouth daily. 30 capsule 11  . diltiazem (CARDIZEM CD) 120 MG 24 hr capsule Take 1 capsule (120 mg total) by mouth daily. 30 capsule 1  . diltiazem (CARDIZEM) 30 MG tablet Take 1 tablet (30 mg total) by mouth every 6 (six) hours as needed (for palpitations or afib). 30 tablet 0     Review of Systems Full ROS  was asked and was negative except for the information on the HPI  Physical Exam Blood pressure 140/90, pulse (!) 102, temperature 98.7 F (37.1 C), temperature source Oral, resp. rate 20, height 5'  4" (1.626 m), weight 104.3 kg (230 lb), last menstrual period 02/09/2014, SpO2 95 %. CONSTITUTIONAL: Obese female EYES: Pupils are equal, round, and reactive to light, Sclera are non-icteric. EARS, NOSE, MOUTH AND THROAT: The oropharynx is clear. The oral mucosa is pink and moist. Hearing is intact to voice. LYMPH NODES:  Lymph nodes in the neck are normal. RESPIRATORY:  Lungs are clear. There is normal respiratory effort, with equal breath sounds bilaterally, and without pathologic use of accessory muscles. CARDIOVASCULAR: Heart is regular without murmurs, gallops, or rubs. GI: The abdomen is  soft, TTP over Mcburney's point. Previous lower midline scar. THere is a small pustule on her RLQ. GU: Rectal deferred.   MUSCULOSKELETAL: Normal muscle  strength and tone. No cyanosis or edema.   SKIN: Turgor is good and there are no pathologic skin lesions or ulcers. NEUROLOGIC: Motor and sensation is grossly normal. Cranial nerves are grossly intact. PSYCH:  Oriented to person, place and time. Affect is normal.  Data Reviewed  I have personally reviewed the patient's imaging, laboratory findings and medical records.    Assessment/ Plan 47 year old female with classic signs and symptoms consistent with acute appendicitis and confirmed by CT.  Discussed with the patient in detail about her disease process.  The risks, benefits, complications, treatment options, and expected outcomes were discussed with the patient. The treatment of antibiotics alone was discussed giving a 20% chance that this could fail and surgery would be necessary.  Also discussed continuing to the operating room for Laparoscopic Appendectomy.  The possibilities of  bleeding, recurrent infection, perforation of viscus, finding a normal appendix, the need for additional procedures, failure to diagnose a condition, conversion to open procedure and creating a complication requiring transfusion or further operations were discussed. The patient was given the opportunity to ask questions and have them answered.  Patient would like to proceed with Laparoscopic Appendectomy and consent was obtained.  Since Dr. Excell Seltzerooper will be coming for a night shift he will be the actual operating surgeon.  Sterling Bigiego Paras Kreider, MD FACS General Surgeon 10/16/2017, 6:20 PM

## 2017-10-16 NOTE — Anesthesia Preprocedure Evaluation (Signed)
Anesthesia Evaluation  Patient identified by MRN, date of birth, ID band Patient awake    Reviewed: Allergy & Precautions, NPO status , Patient's Chart, lab work & pertinent test results, reviewed documented beta blocker date and time   Airway Mallampati: III  TM Distance: >3 FB     Dental  (+) Chipped   Pulmonary Current Smoker,           Cardiovascular + dysrhythmias Atrial Fibrillation      Neuro/Psych    GI/Hepatic   Endo/Other    Renal/GU      Musculoskeletal   Abdominal   Peds  Hematology   Anesthesia Other Findings Obese. HR stays about 110.  Reproductive/Obstetrics                             Anesthesia Physical Anesthesia Plan  ASA: III  Anesthesia Plan: General   Post-op Pain Management:    Induction: Intravenous  PONV Risk Score and Plan:   Airway Management Planned: Oral ETT  Additional Equipment:   Intra-op Plan:   Post-operative Plan:   Informed Consent: I have reviewed the patients History and Physical, chart, labs and discussed the procedure including the risks, benefits and alternatives for the proposed anesthesia with the patient or authorized representative who has indicated his/her understanding and acceptance.     Plan Discussed with: CRNA  Anesthesia Plan Comments:         Anesthesia Quick Evaluation

## 2017-10-16 NOTE — Op Note (Signed)
laparascopic appendectomy   Teea A Dowse Date of operation:  10/16/2017  Indications: The patient presented with a history of  abdominal pain. Workup has revealed findings consistent with acute appendicitis.  Pre-operative Diagnosis: Acute appendicitis  Post-operative Diagnosis: Acute separative appendicitis  Surgeon: Adah Salvageichard E. Excell Seltzerooper, MD, FACS  Anesthesia: General with endotracheal tube  Procedure Details  The patient was seen again in the preop area. The options of surgery versus observation were reviewed with the patient and/or family. The risks of bleeding, infection, recurrence of symptoms, negative laparoscopy, potential for an open procedure, bowel injury, abscess or infection, were all reviewed as well. The patient was taken to Operating Room, identified as Dominique Sullivan and the procedure verified as laparoscopic appendectomy. A Time Out was held and the above information confirmed.  The patient was placed in the supine position and general anesthesia was induced.  Antibiotic prophylaxis was administered and VT E prophylaxis was in place. A Foley catheter was placed by the nursing staff.   The abdomen was prepped and draped in a sterile fashion. An infraumbilical incision was made. A Veress needle was placed and pneumoperitoneum was obtained. A 5 mm trocar port was placed without difficulty and the abdominal cavity was explored.  Under direct vision a 5 mm suprapubic port was placed and a 13 mm left lateral port was placed all under direct vision.  The appendix was identified and found to be acutely inflamed . The appendix was carefully dissected. The base of the appendix was dissected out and divided with a standard load Endo GIA. The mesoappendix was divided with a vascular load Endo GIA.  There was some bleeding on the vascular staple line which was handled with surgical clips which stopped the minor hemorrhage. The appendix was passed out through the left lateral port site with  the aid of an Endo Catch bag. The right lower quadrant and pelvis was then irrigated with copious amounts of normal saline which was aspirated. Inspection  failed to identify any additional bleeding and there were no signs of bowel injury. Therefore the left lateral port site was closed under direct vision utilizing an Endo Close technique with 0 Vicryl interrupted sutures, all under direct vision.   Again the right lower quadrant was inspected there was no sign of bleeding or bowel injury therefore pneumoperitoneum was released, all ports were removed and the skin incisions were approximated with subcuticular 4-0 Monocryl. Steri-Strips and Mastisol and sterile dressings were placed.  The patient tolerated the procedure well, there were no complications. The sponge lap and needle count were correct at the end of the procedure.  The patient was taken to the recovery room in stable condition to be admitted for continued care.  Findings: Acute separative appendicitis, nonruptured  Estimated Blood Loss: Minimal                  Specimens: appendix         Complications: None                  Dominique Sullivan E. Excell Seltzerooper MD, FACS

## 2017-10-17 ENCOUNTER — Encounter: Payer: Self-pay | Admitting: Surgery

## 2017-10-17 MED ORDER — HYDROCODONE-ACETAMINOPHEN 5-325 MG PO TABS: 1.0000 | ORAL_TABLET | ORAL | 0 refills | Status: DC | PRN

## 2017-10-17 MED ORDER — GUAIFENESIN-DM 100-10 MG/5ML PO SYRP
5.0000 mL | ORAL_SOLUTION | ORAL | Status: DC | PRN
  Administered 2017-10-17 (×2): 5 mL via ORAL
  Filled 2017-10-17 (×2): qty 5

## 2017-10-17 NOTE — Progress Notes (Signed)
Discharge instructions reviewed with the patient. rx given for norco.  Patient will be sent out via wheelchair to her waiting ride

## 2017-10-17 NOTE — Progress Notes (Signed)
Pt alert and oriented.Medicated for pain and cough with relief. Dsg x 3 intact. Ambulated to bathroom. Rested quietly during the night with significant other at bedside. No acute distress noted. Will continue to monitor

## 2017-10-17 NOTE — Discharge Instructions (Signed)
Remove dressing in 24 hours. °May shower in 24 hours. °Leave paper strips in place. °Resume all home medications. °Follow-up with Dr. Amillia Biffle in 10 days. °

## 2017-10-17 NOTE — Anesthesia Postprocedure Evaluation (Signed)
Anesthesia Post Note  Patient: Dominique Sullivan  Procedure(s) Performed: APPENDECTOMY LAPAROSCOPIC (N/A Abdomen)  Patient location during evaluation: PACU Anesthesia Type: General Level of consciousness: awake and alert Pain management: pain level controlled Vital Signs Assessment: post-procedure vital signs reviewed and stable Respiratory status: spontaneous breathing, nonlabored ventilation, respiratory function stable and patient connected to nasal cannula oxygen Cardiovascular status: blood pressure returned to baseline and stable Postop Assessment: no apparent nausea or vomiting Anesthetic complications: no     Last Vitals:  Vitals:   10/16/17 2226 10/17/17 0341  BP: (!) 115/59 128/72  Pulse: (!) 108 99  Resp: 18 18  Temp: 37.4 C 36.6 C  SpO2: 92% 94%    Last Pain:  Vitals:   10/17/17 0520  TempSrc:   PainSc: 3                  Elesha Thedford S

## 2017-10-18 LAB — HIV ANTIBODY (ROUTINE TESTING W REFLEX): HIV Screen 4th Generation wRfx: NONREACTIVE

## 2017-10-18 LAB — SURGICAL PATHOLOGY

## 2017-10-24 ENCOUNTER — Encounter: Payer: Self-pay | Admitting: Surgery

## 2017-10-24 ENCOUNTER — Ambulatory Visit (INDEPENDENT_AMBULATORY_CARE_PROVIDER_SITE_OTHER): Payer: Self-pay | Admitting: Surgery

## 2017-10-24 VITALS — BP 137/89 | HR 88 | Temp 98.3°F | Wt 237.0 lb

## 2017-10-24 DIAGNOSIS — K3580 Unspecified acute appendicitis: Secondary | ICD-10-CM

## 2017-10-24 NOTE — Patient Instructions (Addendum)
Please take Ibuprofen 800 MG every 6 hours if needed for pain.  Please go to your local pharmacy and get clotrimazole ointment or powder.

## 2017-10-24 NOTE — Progress Notes (Signed)
Outpatient postop visit  10/24/2017  Dominique Sullivan is an 47 y.o. female.    Procedure: Laparoscopic appendectomy  ZO:[id number]:mild burning abd pain at left lower quadrant incision  HPI: This a patient status post laparoscopic appendectomy.  She complains of some mild burning abdominal pain.  This is all related to her left lower quadrant 13 mm incision.  She has no nausea vomiting fevers or chills.  She has not had any problems eating or with bowel movements.  Medications reviewed.    Physical Exam:  LMP 02/09/2014 Comment: neg preg test 10/16/17    PE: Morbidly obese female patient in no acute distress vital signs are reviewed.  Abdomen is soft nondistended nontympanitic and nontender but obese ecchymosis is present in the area where she was obtaining her heparin injections and only minimal ecchymosis around the other incisions without erythema or drainage    Assessment/Plan:  This patient status post laparoscopic appendectomy.  Pathology has been checked and showed no sign of malignancy but confirm the diagnosis.  The left lower quadrant incision is causing her some discomfort and burning pain but is typical following a laparoscopic appendectomy with a 13 mm port site.  Recommended reexamining in 2 weeks.  Lattie Hawichard E Levi Klaiber, MD, FACS

## 2017-11-12 ENCOUNTER — Encounter: Payer: Self-pay | Admitting: Surgery

## 2017-11-13 ENCOUNTER — Encounter: Payer: Self-pay | Admitting: Surgery

## 2018-12-03 ENCOUNTER — Telehealth: Payer: Self-pay

## 2018-12-03 NOTE — Telephone Encounter (Signed)
Virtual Visit Pre-Appointment Phone Call  "(Name), I am calling you today to discuss your upcoming appointment. We are currently trying to limit exposure to the virus that causes COVID-19 by seeing patients at home rather than in the office."  1. "What is the BEST phone number to call the day of the visit?" - include this in appointment notes  2. "Do you have or have access to (through a family member/friend) a smartphone with video capability that we can use for your visit?" a. If yes - list this number in appt notes as "cell" (if different from BEST phone #) and list the appointment type as a VIDEO visit in appointment notes b. If no - list the appointment type as a PHONE visit in appointment notes  3. Confirm consent - "In the setting of the current Covid19 crisis, you are scheduled for a VIDEO visit with your provider on 12/05/2018 at 10:40AM.  Just as we do with many in-office visits, in order for you to participate in this visit, we must obtain consent.  If you'd like, I can send this to your mychart (if signed up) or email for you to review.  Otherwise, I can obtain your verbal consent now.  All virtual visits are billed to your insurance company just like a normal visit would be.  By agreeing to a virtual visit, we'd like you to understand that the technology does not allow for your provider to perform an examination, and thus may limit your provider's ability to fully assess your condition. If your provider identifies any concerns that need to be evaluated in person, we will make arrangements to do so.  Finally, though the technology is pretty good, we cannot assure that it will always work on either your or our end, and in the setting of a video visit, we may have to convert it to a phone-only visit.  In either situation, we cannot ensure that we have a secure connection.  Are you willing to proceed?" STAFF: Did the patient verbally acknowledge consent to telehealth visit? Document YES/NO  here: YES  4. Advise patient to be prepared - "Two hours prior to your appointment, go ahead and check your blood pressure, pulse, oxygen saturation, and your weight (if you have the equipment to check those) and write them all down. When your visit starts, your provider will ask you for this information. If you have an Apple Watch or Kardia device, please plan to have heart rate information ready on the day of your appointment. Please have a pen and paper handy nearby the day of the visit as well."  5. Give patient instructions for MyChart download to smartphone OR Doximity/Doxy.me as below if video visit (depending on what platform provider is using)  6. Inform patient they will receive a phone call 15 minutes prior to their appointment time (may be from unknown caller ID) so they should be prepared to answer    TELEPHONE CALL NOTE  Dominique Sullivan has been deemed a candidate for a follow-up tele-health visit to limit community exposure during the Covid-19 pandemic. I spoke with the patient via phone to ensure availability of phone/video source, confirm preferred email & phone number, and discuss instructions and expectations.  I reminded Dominique Sullivan to be prepared with any vital sign and/or heart rhythm information that could potentially be obtained via home monitoring, at the time of her visit. I reminded Dominique Sullivan to expect a phone call prior to her visit.  Dominique RudBrittany N Ahmira Sullivan, [place] 12/03/2018 10:50 AM   INSTRUCTIONS FOR DOWNLOADING THE MYCHART APP TO SMARTPHONE  - The patient must first make sure to have activated MyChart and know their login information - If Apple, go to [employer] Store and type in MyChart in the search bar and download the app. If Android, ask patient to go to [employer] Play Store and type in ScioMyChart in the search bar and download the app. The app is free but as with any other app downloads, their phone may require them to verify saved payment information or Apple/Android  password.  - The patient will need to then log into the app with their MyChart username and password, and select [facility] as their healthcare provider to link the account. When it is time for your visit, go to the MyChart app, find appointments, and click Begin Video Visit. Be sure to Select Allow for your device to access the Microphone and Camera for your visit. You will then be connected, and your provider will be with you shortly.  **If they have any issues connecting, or need assistance please contact MyChart service desk [phone])**  **If using a computer, in order to ensure the best quality for their visit they will need to use either of the following Internet Browsers: [employer] Edge, or Google Chrome**  IF USING DOXIMITY or DOXY.ME - The patient will receive a link just prior to their visit by text.     FULL LENGTH CONSENT FOR TELE-HEALTH VISIT   I hereby voluntarily request, consent and authorize [facility] and its employed or contracted physicians, physician assistants, nurse practitioners or other licensed health care professionals (the Practitioner), to provide me with telemedicine health care services (the "Services") as deemed necessary by the treating Practitioner. I acknowledge and consent to receive the Services by the Practitioner via telemedicine. I understand that the telemedicine visit will involve communicating with the Practitioner through live audiovisual communication technology and the disclosure of certain medical information by electronic transmission. I acknowledge that I have been given the opportunity to request an in-person assessment or other available alternative prior to the telemedicine visit and am voluntarily participating in the telemedicine visit.  I understand that I have the right to withhold or withdraw my consent to the use of telemedicine in the course of my care at any time, without affecting my right to future care or treatment,  and that the Practitioner or I may terminate the telemedicine visit at any time. I understand that I have the right to inspect all information obtained and/or recorded in the course of the telemedicine visit and may receive copies of available information for a reasonable fee.  I understand that some of the potential risks of receiving the Services via telemedicine include:  Marland Kitchen. Delay or interruption in medical evaluation due to technological equipment failure or disruption; . Information transmitted may not be sufficient (e.g. poor resolution of images) to allow for appropriate medical decision making by the Practitioner; and/or  . In rare instances, security protocols could fail, causing a breach of personal health information.  Furthermore, I acknowledge that it is my responsibility to provide information about my medical history, conditions and care that is complete and accurate to the best of my ability. I acknowledge that Practitioner's advice, recommendations, and/or decision may be based on factors not within their control, such as incomplete or inaccurate data provided by me or distortions of diagnostic images or specimens that may result from electronic transmissions. I understand that  the practice of medicine is not an exact science and that Practitioner makes no warranties or guarantees regarding treatment outcomes. I acknowledge that I will receive a copy of this consent concurrently upon execution via email to the email address I last provided but may also request a printed copy by calling the office of [facility].    I understand that my insurance will be billed for this visit.   I have read or had this consent read to me. . I understand the contents of this consent, which adequately explains the benefits and risks of the Services being provided via telemedicine.  . I have been provided ample opportunity to ask questions regarding this consent and the Services and have had my questions  answered to my satisfaction. . I give my informed consent for the services to be provided through the use of telemedicine in my medical care  By participating in this telemedicine visit I agree to the above.

## 2018-12-05 ENCOUNTER — Telehealth: Payer: Self-pay

## 2018-12-05 ENCOUNTER — Telehealth (INDEPENDENT_AMBULATORY_CARE_PROVIDER_SITE_OTHER): Payer: Self-pay | Admitting: Cardiovascular Disease

## 2018-12-05 ENCOUNTER — Telehealth: Payer: Self-pay | Admitting: Cardiovascular Disease

## 2018-12-05 ENCOUNTER — Other Ambulatory Visit: Payer: Self-pay

## 2018-12-05 VITALS — Ht 63.0 in | Wt 230.0 lb

## 2018-12-05 DIAGNOSIS — I480 Paroxysmal atrial fibrillation: Secondary | ICD-10-CM

## 2018-12-05 DIAGNOSIS — J0110 Acute frontal sinusitis, unspecified: Secondary | ICD-10-CM

## 2018-12-05 DIAGNOSIS — I10 Essential (primary) hypertension: Secondary | ICD-10-CM

## 2018-12-05 MED ORDER — DILTIAZEM HCL ER COATED BEADS 120 MG PO CP24: 120.0000 mg | ORAL_CAPSULE | Freq: Every day | ORAL | 3 refills | Status: AC

## 2018-12-05 MED ORDER — AMOXICILLIN-POT CLAVULANATE 875-125 MG PO TABS: 1.0000 | ORAL_TABLET | Freq: Two times a day (BID) | ORAL | 0 refills | Status: AC

## 2018-12-05 MED ORDER — DILTIAZEM HCL 30 MG PO TABS: 30.0000 mg | ORAL_TABLET | Freq: Three times a day (TID) | ORAL | 1 refills | Status: AC

## 2018-12-05 NOTE — Telephone Encounter (Signed)
error 

## 2018-12-05 NOTE — Telephone Encounter (Signed)
Patient returned call.  Obtained vitals.  Reviewed medications and allergies with patient.  Preformed a Doxy Trial.  Video and Audio worked

## 2018-12-05 NOTE — Telephone Encounter (Addendum)
Returned the call to the pt pharmacy [facility]. Spoke with Bri. Bri sts that they have received 2 prescriptions for the pt Diltiazem and they are needing clarification. Adv Bri per Dr.Gollan's 12/05/18 o/v note  PAF (paroxysmal atrial fibrillation) (HCC) Recommend she take diltiazem extended release 120 mg daily Having continued episodes of atrial fibrillation Also has high blood pressure She will continue diltiazem 30 mg pills as needed for breakthrough palpitations  Bri verbalized understanding and voiced appreciation for the call

## 2018-12-05 NOTE — Patient Instructions (Addendum)
PLEASE CALL [phone] and they will help you find a primary care provider to assist with your healthcare.   Medication Instructions:  Your physician has recommended you make the following change in your medication:  1. Refills sent in for Diltiazem 2. START Augmentin 1 tablet twice a day for 10 days  If you need a refill on your cardiac medications before your next appointment, please call your pharmacy.    Lab work: No new labs needed   If you have labs (blood work) drawn today and your tests are completely normal, you will receive your results only by: Marland Kitchen MyChart Message (if you have MyChart) OR . A paper copy in the mail If you have any lab test that is abnormal or we need to change your treatment, we will call you to review the results.   Testing/Procedures: No new testing needed   Follow-Up: At [facility], you and your health needs are our priority.  As part of our continuing mission to provide you with exceptional heart care, we have created designated Provider Care Teams.  These Care Teams include your primary Cardiologist (physician) and Advanced Practice Providers (APPs -  Physician Assistants and Nurse Practitioners) who all work together to provide you with the care you need, when you need it.  . You will need a follow up appointment in 6 months .   Please call our office 2 months in advance to schedule this appointment.    . Providers on your designated Care Team:   . Nicolasa Ducking, NP . Eula Listen, PA-C . Marisue Ivan, PA-C  Any Other Special Instructions Will Be Listed Below (If Applicable).  For educational health videos Log in to : [email or web address] Or : [email or web address], password : [email or web address]

## 2018-12-05 NOTE — Telephone Encounter (Signed)
Called patient.  No answer. LMOV.  Need to review medications and allergies Need to obtain vitals.

## 2018-12-05 NOTE — Progress Notes (Signed)
Virtual Visit via Video Note   This visit type was conducted due to national recommendations for restrictions regarding the COVID-19 Pandemic (e.g. social distancing) in an effort to limit this patient's exposure and mitigate transmission in our community.  Due to her co-morbid illnesses, this patient is at least at moderate risk for complications without adequate follow up.  This format is felt to be most appropriate for this patient at this time.  All issues noted in this document were discussed and addressed.  A limited physical exam was performed with this format.  Please refer to the patient's chart for her consent to telehealth for [facility].   I connected with  Martyna A Jaffe on 12/05/18 by a video enabled telemedicine application and verified that I am speaking with the correct person using two identifiers. I discussed the limitations of evaluation and management by telemedicine. The patient expressed understanding and agreed to proceed.   Evaluation Performed:  Follow-up visit  Date:  12/05/2018   ID:  Dominique Sullivan, DOB 01-Mar-1971, MRN [id number]  Patient Location:  [address] [place] [address]   Provider location:   [facility]  office  PCP:  Patient, No Pcp Per  Cardiologist:  Fonnie Mu   Chief Complaint: Palpitations concerning for atrial fibrillation    History of Present Illness:    Dominique Sullivan is a 48 y.o. female who presents via audio/video conferencing for a telehealth visit today.   The patient does not symptoms concerning for COVID-19 infection (fever, chills, cough, or new SHORTNESS OF BREATH).   Patient has a past medical history of palpitations ,  obesity,  tobacco abuse,  admitted to [facility] December 2016 with pneumonia,   found to be in atrial fibrillation, converted with  diltiazem  She presents today for follow-up of her atrial fibrillation  On her follow-up today she reports that  she is doing relatively well stopped smoking may 27th 2017. 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 Had lancing of her boil. Atrial fibrillation by the end of the day  Since then she denies having any further episodes   significant weight gain since she stopped smoking may 27th 2017. Would like to take weight loss supplements   Prior CV studies:   The following studies were reviewed today:     Past Medical History:  Diagnosis Date  . Morbid obesity (HCC)   . PAF (paroxysmal atrial fibrillation) (HCC)    a. patient reported tachy-palpitations in 2010 (no documented Afib at that time as she did not seek medical care); b. documented Afib 07/2015, CHA2DS2VASc = 1 (ASA only);  c. 07/2015 Echo: EF 60-65%, nl LA size, nl  RV.  . Tobacco abuse    Past Surgical History:  Procedure Laterality Date  . CESAREAN SECTION    . LAPAROSCOPIC APPENDECTOMY N/A 10/16/2017   Procedure: APPENDECTOMY LAPAROSCOPIC;  Surgeon: Lattie Haw, MD;  Location: [facility] ORS;  Service: General;  Laterality: N/A;     Current Meds  Medication Sig  . aspirin EC 81 MG EC tablet Take 1 tablet (81 mg total) by mouth  daily.  Marland Kitchen diltiazem (CARDIZEM) 30 MG tablet Take 1 tablet (30 mg total) by mouth 3 (three) times daily.  . [DISCONTINUED] diltiazem (CARDIZEM) 30 MG tablet Take 1 tablet (30 mg total) by mouth every 6 (six) hours as needed (for palpitations or afib).     Allergies:   Biaxin [clarithromycin]   Social History   Tobacco Use  . Smoking status: Current Every Day Smoker    Packs/day: 1.00    Years: 31.00    Pack years: 31.00    Types: Cigarettes    Last attempt to quit: 01/07/2016    Years since quitting: 2.9  . Smokeless tobacco: Never Used  Substance Use Topics  . Alcohol use: Yes    Alcohol/week: 0.0 standard drinks  . Drug use: No    Comment: remote MJ use as teenager - denies any other illegal drug abuse     Current Outpatient Medications on File Prior to Visit  Medication Sig Dispense Refill  . aspirin EC 81 MG EC tablet Take 1 tablet (81 mg total) by mouth daily.     No current facility-administered medications on file prior to visit.      Family Hx: The patient's family history includes Diabetes in her mother; Hyperlipidemia in her father; Hypertension in her mother; Stroke in her maternal grandfather and maternal grandmother.  ROS:   Please see the history of present illness.    Review of Systems  Constitutional: Negative.   HENT: Positive for congestion, ear pain and sinus pain.   Respiratory: Negative.   Cardiovascular: Negative.   Gastrointestinal: Negative.   Musculoskeletal: Negative.   Neurological: Negative.   Psychiatric/Behavioral: Negative.   All other systems reviewed and are negative.     Labs/Other Tests and Data Reviewed:    Recent Labs: No results found for requested labs within last 8760 hours.   Recent Lipid Panel No results found for: CHOL, TRIG, HDL, CHOLHDL, LDLCALC, LDLDIRECT  Wt Readings from Last 3 Encounters:  12/05/18 230 lb (104.3 kg)  10/24/17 237 lb (107.5 kg)  10/16/17 230 lb (104.3 kg)     Exam:    Vital Signs: Vital  signs may also be detailed in the HPI Ht 5\' 3"  (1.6 m)   Wt 230 lb (104.3 kg)   LMP 02/09/2014 Comment: neg preg test 10/16/17  BMI 40.74 kg/m   Wt Readings from Last 3 Encounters:  12/05/18 230 lb (104.3 kg)  10/24/17 237 lb (107.5 kg)  10/16/17 230 lb (104.3 kg)   Temp Readings from Last 3 Encounters:  10/24/17 98.3 F (36.8 C) (Oral)  10/17/17 98.6 F (37 C) (Oral)  12/28/15 98.1 F (36.7 C) (Oral)   BP Readings from Last 3 Encounters:  10/24/17 137/89  10/17/17 121/66  02/01/16 110/78   Pulse Readings from Last 3 Encounters:  10/24/17 88  10/17/17 97  02/01/16 70    Blood pressure   139/78 Pulse 120 Resp 16  Well nourished, well developed female in no acute distress. Constitutional:  oriented to person, place, and time. No distress.  Head: Normocephalic  and atraumatic.  Eyes:  no discharge. No scleral icterus.  Neck: Normal range of motion. Neck supple.  Pulmonary/Chest: No audible wheezing, no distress, appears comfortable Musculoskeletal: Normal range of motion.  no  tenderness or deformity.  Neurological:   Coordination normal. Full exam not performed Skin:  No rash Psychiatric:  normal mood and affect. behavior is normal. Thought content normal.    ASSESSMENT & PLAN:    PAF (paroxysmal atrial fibrillation) (HCC) Recommend she take diltiazem extended release 120 mg daily Having continued episodes of atrial fibrillation Also has high blood pressure She will continue diltiazem 30 mg pills as needed for breakthrough palpitations Long discussion concerning risk of benefit of atrial fibrillation, long-term effects She does report baseline sinus tachycardia often reporting heart rate 120 at rest not in atrial fibrillation Again reiterated probably need to keep her rate slower and start the diltiazem  Morbid obesity (HCC) We have encouraged continued exercise, careful diet management in an effort to lose weight.  Acute non-recurrent frontal sinusitis Long  discussion with her concerning sinusitis symptoms We will prescribe Augmentin 1 tab twice daily for 10 days We will set her up with primary care  Essential hypertension As above recommended she start the diltiazem extended release 120 mg daily with close monitoring of her blood pressure   COVID-19 Education: The signs and symptoms of COVID-19 were discussed with the patient and how to seek care for testing (follow up with PCP or arrange E-visit).  The importance of social distancing was discussed today.  Patient Risk:   After full review of this patients clinical status, I feel that they are at least moderate risk at this time.  Time:   Today, I have spent 25 minutes with the patient with telehealth technology discussing the cardiac and medical problems/diagnoses detailed above   10 min spent reviewing the chart prior to patient visit today   Medication Adjustments/Labs and Tests Ordered: Current medicines are reviewed at length with the patient today.  Concerns regarding medicines are outlined above.   Tests Ordered: No tests ordered   Medication Changes: Changes as detailed above   Disposition: Follow-up in 6 months   Signed, Julien Nordmannimothy Jarl Sellitto, MD  12/05/2018 11:59 AM    [facility] [place] Office [address], [place], [place] [id number]

## 2018-12-05 NOTE — Telephone Encounter (Signed)
Pt c/o medication issue:  1. Name of Medication: diltiazem   2. How are you currently taking this medication (dosage and times per day)? See televisit note   3. Are you having a reaction (difficulty breathing--STAT)? No   4. What is your medication issue? Please call pharmacy to confirm dose multiple rx sent

## 2019-09-01 ENCOUNTER — Telehealth: Payer: Self-pay | Admitting: Cardiovascular Disease

## 2019-09-01 NOTE — Telephone Encounter (Signed)
Spoke with patient and she reports abscess on thigh and needs a sulfur antibiotic. Blood pressure and heart rate are elevated and reviewed that she should call her PCP or go to Urgent care. She states that she has no PCP and when she goes to urgent care they do not treat her due to her atrial fibrillation. Advised that I would send message to her provider Dr. Mariah Milling but that I am not sure if we can help with this. She was appreciative for the call back and had no further questions at this time.

## 2019-09-01 NOTE — Telephone Encounter (Signed)
Patient calling  Patient has an abscess on thigh which is bad and red all the way around it This morning BP was 149/125 HR 135 Patient would like to know if Dr Mariah Milling can call in sulfur antibiotics - [facility]  Please call to discuss

## 2019-09-02 NOTE — Telephone Encounter (Signed)
Call to patient to discuss advice from Dr. Mariah Milling. Pt reports that sx are improved and she will call back for an appt at a later time.   Routing to [employer] to make aware.

## 2019-09-02 NOTE — Telephone Encounter (Signed)
We can treat her atrial fib She needs an office visit

## 2020-12-15 ENCOUNTER — Emergency Department
Admission: EM | Admit: 2020-12-15 | Discharge: 2020-12-15 | Disposition: A | Discharge status: Home / Self Care | Payer: Self-pay | Attending: Emergency Medicine | Admitting: Emergency Medicine

## 2020-12-15 ENCOUNTER — Emergency Department: Payer: Self-pay

## 2020-12-15 ENCOUNTER — Other Ambulatory Visit: Payer: Self-pay

## 2020-12-15 DIAGNOSIS — F17210 Nicotine dependence, cigarettes, uncomplicated: Secondary | ICD-10-CM | POA: Insufficient documentation

## 2020-12-15 DIAGNOSIS — R079 Chest pain, unspecified: Secondary | ICD-10-CM | POA: Insufficient documentation

## 2020-12-15 DIAGNOSIS — Z7982 Long term (current) use of aspirin: Secondary | ICD-10-CM | POA: Insufficient documentation

## 2020-12-15 DIAGNOSIS — R030 Elevated blood-pressure reading, without diagnosis of hypertension: Secondary | ICD-10-CM

## 2020-12-15 DIAGNOSIS — I10 Essential (primary) hypertension: Secondary | ICD-10-CM | POA: Insufficient documentation

## 2020-12-15 DIAGNOSIS — G43909 Migraine, unspecified, not intractable, without status migrainosus: Secondary | ICD-10-CM | POA: Insufficient documentation

## 2020-12-15 LAB — BASIC METABOLIC PANEL
Anion gap: 11 (ref 5–15)
BUN: 17 mg/dL (ref 6–20)
CO2: 24 mmol/L (ref 22–32)
Calcium: 9.2 mg/dL (ref 8.9–10.3)
Chloride: 104 mmol/L (ref 98–111)
Creatinine, Ser: 0.71 mg/dL (ref 0.44–1.00)
GFR, Estimated: >60 mL/min (ref >60)
Glucose, Bld: 92 mg/dL (ref 70–99)
Potassium: 3.6 mmol/L (ref 3.5–5.1)
Sodium: 139 mmol/L (ref 135–145)

## 2020-12-15 LAB — CBC
HCT: 43 % (ref 36.0–46.0)
Hemoglobin: 14.8 g/dL (ref 12.0–15.0)
MCH: 30.8 pg (ref 26.0–34.0)
MCHC: 34.4 g/dL (ref 30.0–36.0)
MCV: 89.6 fL (ref 80.0–100.0)
Platelets: 137 10*3/uL — ABNORMAL LOW (ref 150–400)
RBC: 4.8 MIL/uL (ref 3.87–5.11)
RDW: 13.2 % (ref 11.5–15.5)
WBC: 7.3 10*3/uL (ref 4.0–10.5)
nRBC: 0 % (ref 0.0–0.2)

## 2020-12-15 LAB — TROPONIN I (HIGH SENSITIVITY): Troponin I (High Sensitivity): 5 ng/L (ref <18)

## 2020-12-15 MED ORDER — PROCHLORPERAZINE MALEATE 10 MG PO TABS
10.0000 mg | ORAL_TABLET | Freq: Four times a day (QID) | ORAL | 0 refills | Status: AC | PRN
Start: 2020-12-15 — End: ?

## 2020-12-15 MED ORDER — DIPHENHYDRAMINE HCL 50 MG/ML IJ SOLN
25.0000 mg | Freq: Once | INTRAMUSCULAR | Status: AC
  Administered 2020-12-15: 25 mg via INTRAVENOUS
  Filled 2020-12-15: qty 1

## 2020-12-15 MED ORDER — LACTATED RINGERS IV BOLUS
1000.0000 mL | Freq: Once | INTRAVENOUS | Status: AC
  Administered 2020-12-15: 1000 mL via INTRAVENOUS

## 2020-12-15 MED ORDER — KETOROLAC TROMETHAMINE 30 MG/ML IJ SOLN
15.0000 mg | Freq: Once | INTRAMUSCULAR | Status: AC
  Administered 2020-12-15: 15 mg via INTRAVENOUS
  Filled 2020-12-15: qty 1

## 2020-12-15 MED ORDER — PROCHLORPERAZINE EDISYLATE 10 MG/2ML IJ SOLN
10.0000 mg | Freq: Once | INTRAMUSCULAR | Status: AC
  Administered 2020-12-15: 10 mg via INTRAVENOUS
  Filled 2020-12-15: qty 2

## 2020-12-15 NOTE — ED Provider Notes (Signed)
[facility] Emergency Department Provider Note   ____________________________________________   Event Date/Time   First MD Initiated Contact with Patient 12/15/20 1418     (approximate)  I have reviewed the triage vital signs and the nursing notes.   HISTORY  Chief Complaint Chest Pain and Hypertension    HPI Dominique Sullivan is a 50 y.o. female with past medical history of hypertension, paroxysmal atrial fibrillation, and migraines who presents to the ED complaining of headache.  Patient reports that she has had a worsening headache over the past 3 to 4 days.  Pain initially started around her left eye and is described as a throbbing, has since spread and affected the entirety of her head.  This is associated with nausea and pain is exacerbated by bright lights.  She describes symptoms as similar to prior migraine headaches.  She additionally has noticed recently that her blood pressure has been running high with measurements greater than 200 systolic at home.  She talked to her PCP about this problem, who started her back on diltiazem for her atrial fibrillation yesterday.  She reports an episode of chest tightness earlier today that has since resolved, denies any difficulty breathing.  She reports feeling the tightness today when she was thinking about having to go to the hospital.        Past Medical History:  Diagnosis Date  . Morbid obesity (HCC)   . PAF (paroxysmal atrial fibrillation) (HCC)    a. patient reported tachy-palpitations in 2010 (no documented Afib at that time as she did not seek medical care); b. documented Afib 07/2015, CHA2DS2VASc = 1 (ASA only);  c. 07/2015 Echo: EF 60-65%, nl LA size, nl RV.  . Tobacco abuse     Patient Active Problem List   Diagnosis Date Noted  . Acute non-recurrent frontal sinusitis 12/05/2018  . Essential hypertension 12/05/2018  . Acute appendicitis   . Tobacco abuse   . Morbid obesity (HCC)   . PAF  (paroxysmal atrial fibrillation) (HCC)   . Bronchitis   . Stress at home   . Smoker   . Sleep disorder breathing   . New onset atrial fibrillation (HCC) 08/11/2015    Past Surgical History:  Procedure Laterality Date  . CESAREAN SECTION    . LAPAROSCOPIC APPENDECTOMY N/A 10/16/2017   Procedure: APPENDECTOMY LAPAROSCOPIC;  Surgeon: Lattie Haw, MD;  Location: [facility] ORS;  Service: General;  Laterality: N/A;    Prior to Admission medications   Medication Sig Start Date End Date Taking? Authorizing Provider  prochlorperazine (COMPAZINE) 10 MG tablet Take 1 tablet (10 mg total) by mouth every 6 (six) hours as needed for nausea or vomiting. 12/15/20  Yes Chesley Noon, MD  aspirin EC 81 MG EC tablet Take 1 tablet (81 mg total) by mouth daily. 08/12/15   Alford Highland, MD  diltiazem (CARDIZEM CD) 120 MG 24 hr capsule Take 1 capsule (120 mg total) by mouth daily. 12/05/18 12/05/19  Antonieta Iba, MD  diltiazem (CARDIZEM) 30 MG tablet Take 1 tablet (30 mg total) by mouth 3 (three) times daily. 12/05/18   Antonieta Iba, MD    Allergies Biaxin [clarithromycin]  Family History  Problem Relation Age of Onset  . Diabetes Mother   . Hypertension Mother   . Stroke Maternal Grandmother   . Stroke Maternal Grandfather   . Hyperlipidemia Father     Social History Social History   Tobacco Use  . Smoking status: Current Every Day Smoker  Packs/day: 1.00    Years: 31.00    Pack years: 31.00    Types: Cigarettes    Last attempt to quit: 01/07/2016    Years since quitting: 4.9  . Smokeless tobacco: Never Used  Substance Use Topics  . Alcohol use: Yes    Alcohol/week: 0.0 standard drinks  . Drug use: No    Comment: remote MJ use as teenager - denies any other illegal drug abuse    Review of Systems  Constitutional: No fever/chills Eyes: No visual changes. ENT: No sore throat. Cardiovascular: Positive for chest pain. Respiratory: Denies shortness of  breath. Gastrointestinal: No abdominal pain.  No nausea, no vomiting.  No diarrhea.  No constipation. Genitourinary: Negative for dysuria. Musculoskeletal: Negative for back pain. Skin: Negative for rash. Neurological: Positive for headaches, negative for focal weakness or numbness.  ____________________________________________   PHYSICAL EXAM:  VITAL SIGNS: ED Triage Vitals  Enc Vitals Group     BP 12/15/20 1349 (!) 198/89     Pulse Rate 12/15/20 1349 89     Resp 12/15/20 1349 20     Temp 12/15/20 1349 98.3 F (36.8 C)     Temp Source 12/15/20 1349 Oral     SpO2 12/15/20 1349 98 %     Weight 12/15/20 1347 229 lb 4.5 oz (104 kg)     Height 12/15/20 1347 5\' 3"  (1.6 m)     Head Circumference --      Peak Flow --      Pain Score 12/15/20 1347 8     Pain Loc --      Pain Edu? --      Excl. in GC? --     Constitutional: Alert and oriented. Eyes: Conjunctivae are normal.  Pupils equal round and reactive to light bilaterally. Head: Atraumatic. Nose: No congestion/rhinnorhea. Mouth/Throat: Mucous membranes are moist. Neck: Normal ROM Cardiovascular: Normal rate, regular rhythm. Grossly normal heart sounds. Respiratory: Normal respiratory effort.  No retractions. Lungs CTAB. Gastrointestinal: Soft and nontender. No distention. Genitourinary: deferred Musculoskeletal: No lower extremity tenderness nor edema. Neurologic:  Normal speech and language. No gross focal neurologic deficits are appreciated. Skin:  Skin is warm, dry and intact. No rash noted. Psychiatric: Mood and affect are normal. Speech and behavior are normal.  ____________________________________________   LABS (all labs ordered are listed, but only abnormal results are displayed)  Labs Reviewed  CBC - Abnormal; Notable for the following components:      Result Value   Platelets 137 (*)    All other components within normal limits  BASIC METABOLIC PANEL  TROPONIN I (HIGH SENSITIVITY)    ____________________________________________  EKG  ED ECG REPORT I, 02/14/21, the attending physician, personally viewed and interpreted this ECG.   Date: 12/15/2020  EKG Time: 13:47  Rate: 83  Rhythm: normal sinus rhythm  Axis: Normal  Intervals:none  ST&T Change: Normal   PROCEDURES  Procedure(s) performed (including Critical Care):  Procedures   ____________________________________________   INITIAL IMPRESSION / ASSESSMENT AND PLAN / ED COURSE       50 year old female with past medical history of paroxysmal atrial fibrillation, hypertension, migraines who presents to the ED for worsening headache over the past 3 to 4 days associated with elevated BP and an episode of chest tightness earlier today.  She denies any ongoing chest tightness, EKG shows no evidence of arrhythmia or ischemia and troponin is negative.  Symptoms sound atypical for ACS and she would be appropriate for discharge home given her heart  score of less than 4.  Chest x-ray reviewed by me and shows no infiltrate, edema, or effusion.  Headache sounds consistent with a migraine, CT head is negative for acute process.  Patient reports improvement in headache following Compazine and Benadryl.  Her blood pressure remains elevated but there is no evidence of hypertensive emergency.  Patient was recently restarted on diltiazem yesterday by her PCP and I would hold off on any other blood pressure medications for now as I would not want to drop her blood pressure too low.  She is appropriate for discharge home with PCP follow-up next week for blood pressure recheck.  She was counseled to return to the ED for new worsening symptoms, patient agrees with plan.      ____________________________________________   FINAL CLINICAL IMPRESSION(S) / ED DIAGNOSES  Final diagnoses:  Migraine without status migrainosus, not intractable, unspecified migraine type  Elevated BP without diagnosis of hypertension     ED  Discharge Orders         Ordered    prochlorperazine (COMPAZINE) 10 MG tablet  Every 6 hours PRN        12/15/20 1536           Note:  This document was prepared using Dragon voice recognition software and may include unintentional dictation errors.   Chesley Noon, MD 12/15/20 [phone]

## 2020-12-15 NOTE — ED Triage Notes (Addendum)
BIB [facility] from home due hypertension, headache, nausea, fatigue and intermittent chest tightness over past 3 days. EMS reports pt hypertensive. Pt alert and oriented X4, cooperative, RR even and unlabored, color WNL. Pt in NAD. Takes PO diltiazem for afib, was restarted on it yesterday after being off it.

## 2022-01-02 IMAGING — CT CT HEAD W/O CM
4 series · 17 of 47 positions shown, 19 images · non-contrast
Comparison: None.

CLINICAL DATA: Headache, nausea and fatigue with intermittent chest
tightness over the last 3 days.

EXAM:
CT HEAD WITHOUT CONTRAST
TECHNIQUE: Contiguous axial images were obtained from the base of the skull
through the vertex without intravenous contrast.

[Series 2: head wo · axial · 0.47mm/px · z∈[+320,+425]mm · 7 of 29 slices shown, 9 images]
[im 4/29  brain]
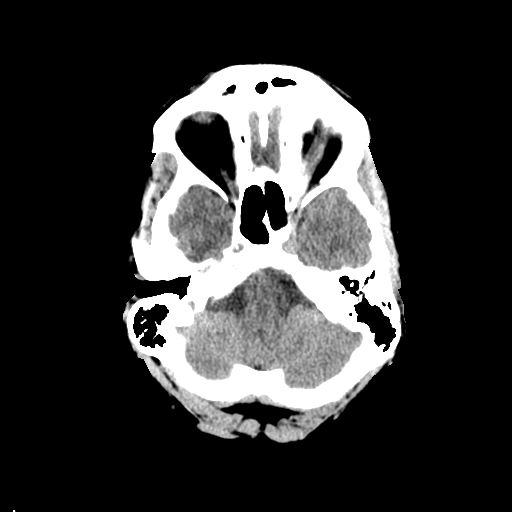
[im 4/29  bone]
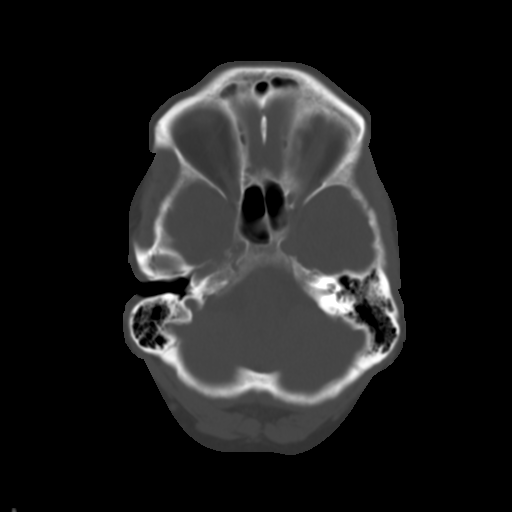
[im 8/29  brain]
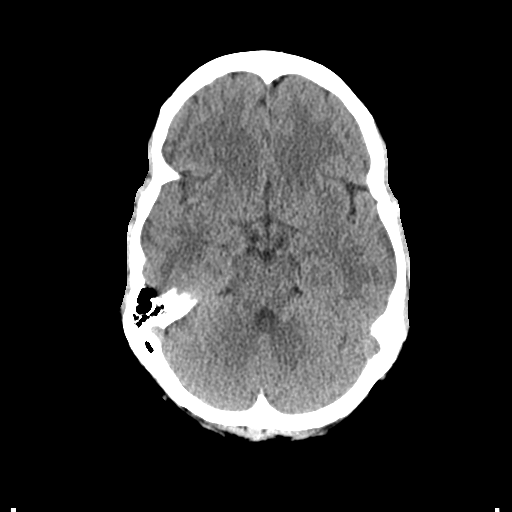
[im 11/29  brain]
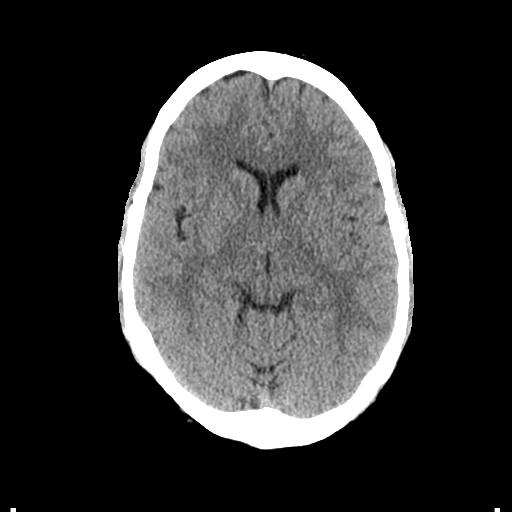
[im 15/29  brain]
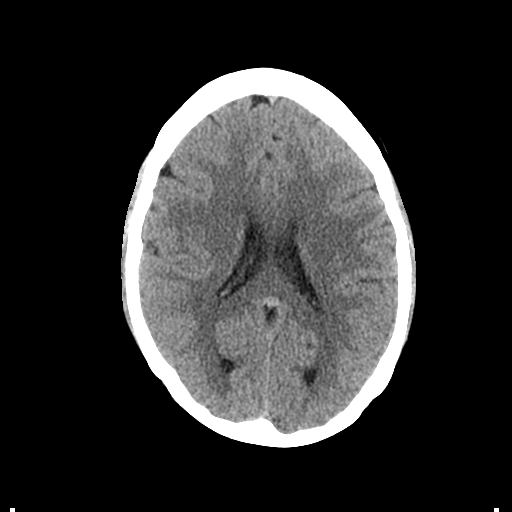
[im 18/29  brain]
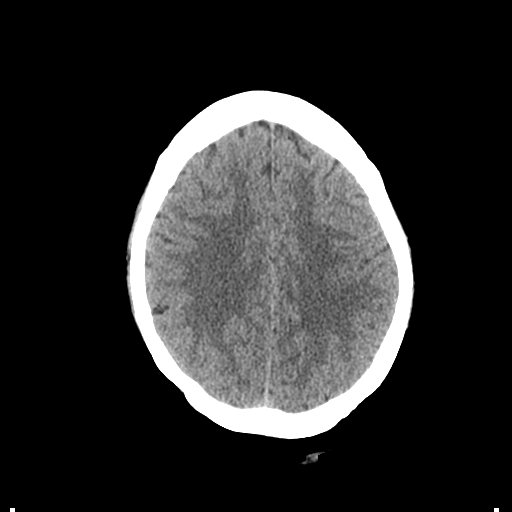
[im 18/29  bone]
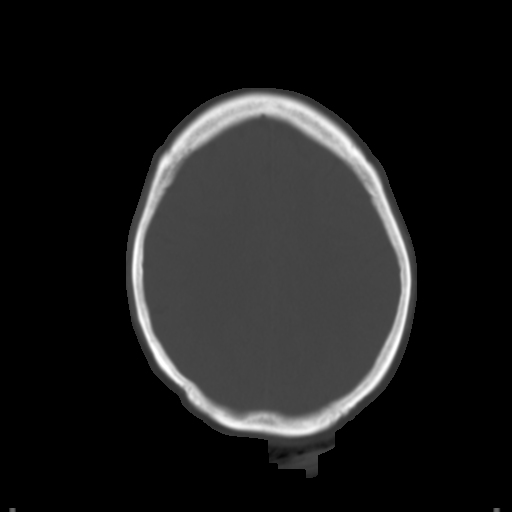
[im 22/29  brain]
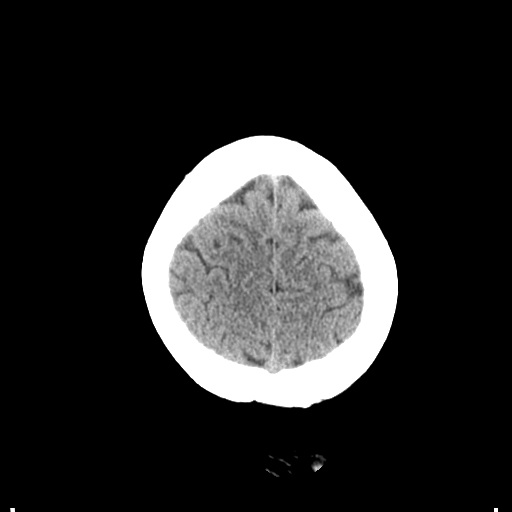
[im 25/29  brain]
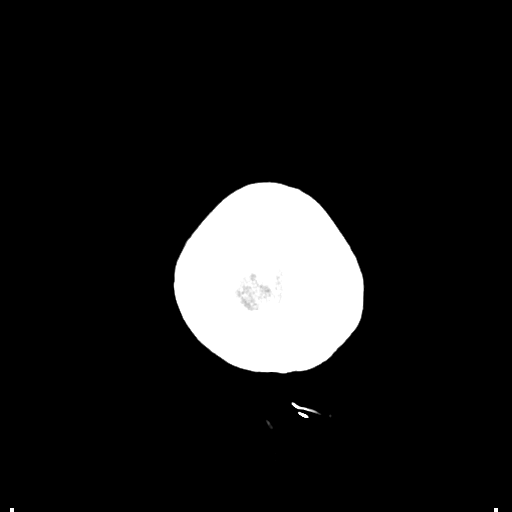

[Series 3: head bone · axial · 0.47mm/px · z∈[+319,+367]mm · 4 of 72 slices shown]
[im 8/72  bone]
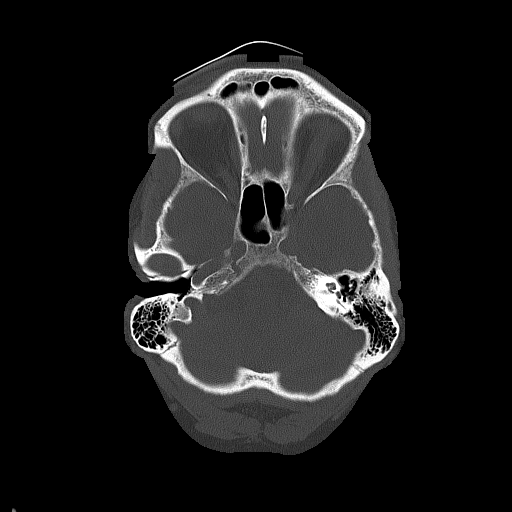
[im 15/72  bone]
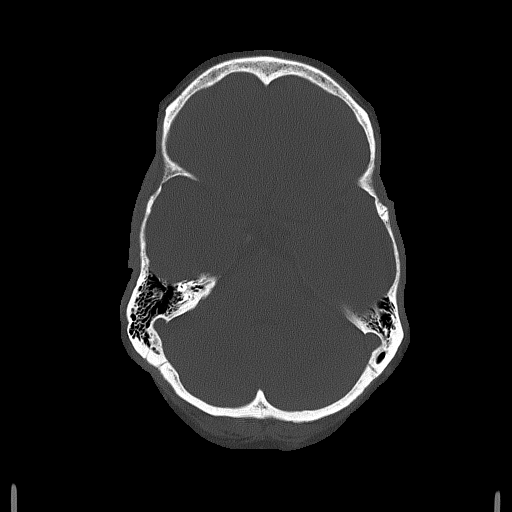
[im 22/72  bone]
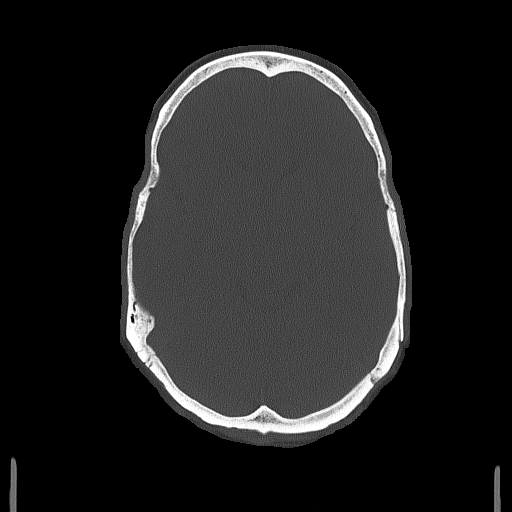
[im 32/72  bone]
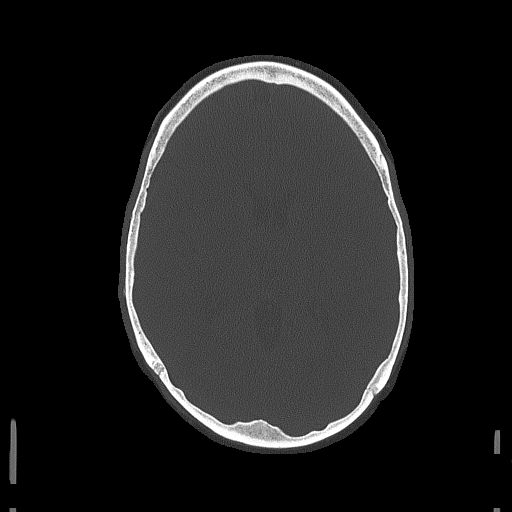

[Series 4: coronal soft tissue · coronal · 0.32mm/px · 3 of 68 slices shown]
[im 23/68  brain]
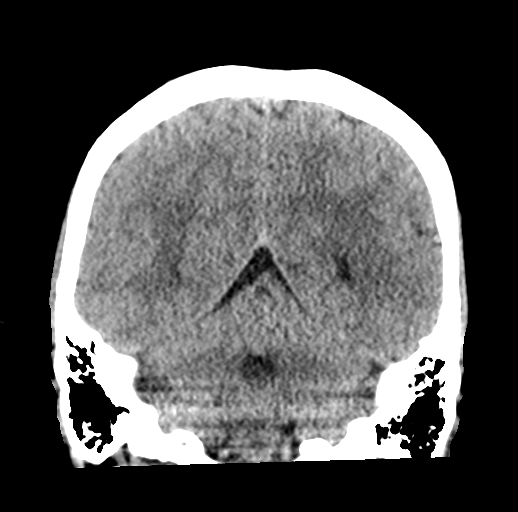
[im 30/68  brain]
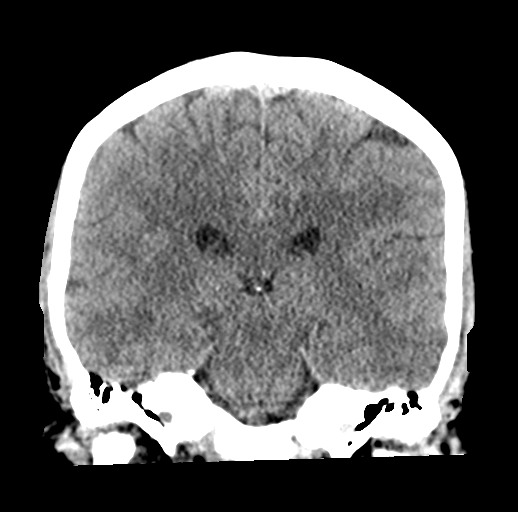
[im 38/68  brain]
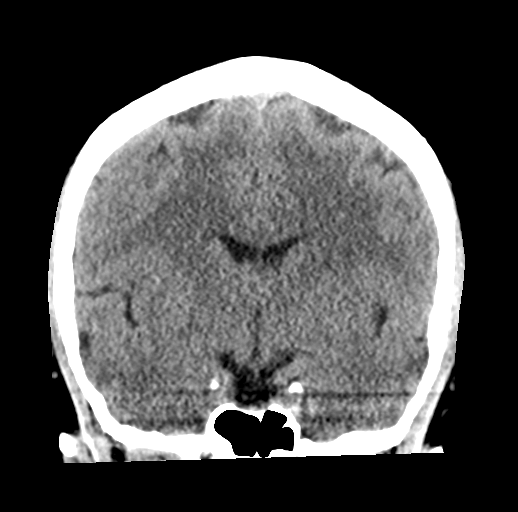

[Series 5: sagittal soft tissue · sagittal · 0.32mm/px · 3 of 53 slices shown]
[im 18/53  brain]
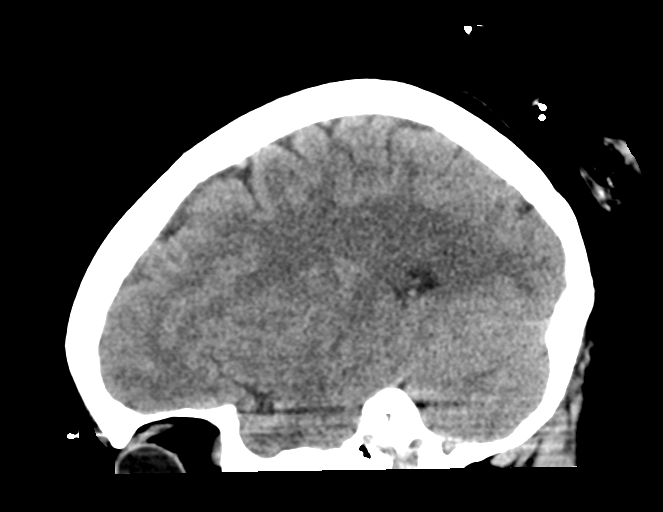
[im 27/53  brain]
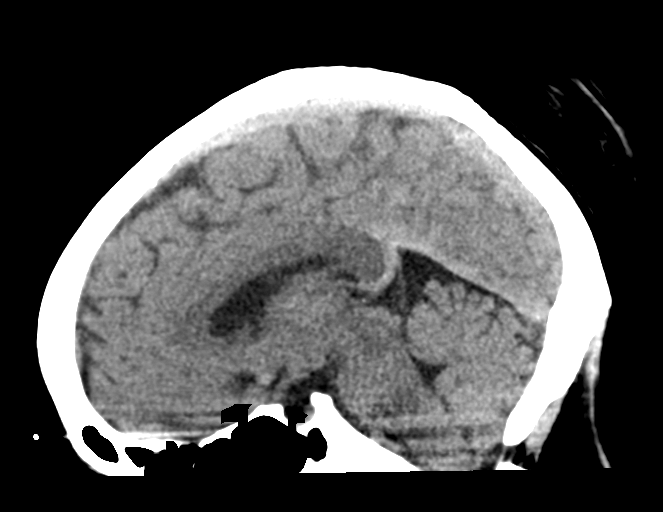
[im 35/53  brain]
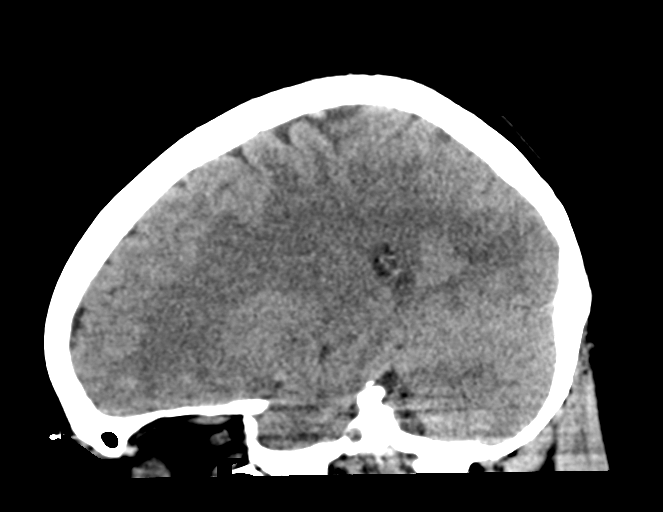

[17 of 47 positions shown; findings below may reference images not displayed]

FINDINGS: Brain: No evidence of acute infarction, hemorrhage, hydrocephalus,
extra-axial collection or mass lesion/mass effect.

Vascular: No hyperdense vessel or unexpected calcification.

Skull: Normal. Negative for fracture or focal lesion.

Sinuses/Orbits: Visualized globes and orbits are unremarkable are
clear. The visualized sinuses

Other: None.
IMPRESSION: Normal unenhanced CT scan of the brain.

## 2023-09-16 ENCOUNTER — Emergency Department
Admission: EM | Admit: 2023-09-16 | Discharge: 2023-09-16 | Disposition: A | Discharge status: Home / Self Care | Payer: Medicaid Other | Attending: Emergency Medicine | Admitting: Emergency Medicine

## 2023-09-16 ENCOUNTER — Encounter: Payer: Self-pay | Admitting: Emergency Medicine

## 2023-09-16 ENCOUNTER — Other Ambulatory Visit: Payer: Self-pay

## 2023-09-16 DIAGNOSIS — J101 Influenza due to other identified influenza virus with other respiratory manifestations: Secondary | ICD-10-CM

## 2023-09-16 DIAGNOSIS — Z7982 Long term (current) use of aspirin: Secondary | ICD-10-CM | POA: Insufficient documentation

## 2023-09-16 DIAGNOSIS — E860 Dehydration: Secondary | ICD-10-CM | POA: Diagnosis not present

## 2023-09-16 DIAGNOSIS — R531 Weakness: Secondary | ICD-10-CM

## 2023-09-16 DIAGNOSIS — Z20822 Contact with and (suspected) exposure to covid-19: Secondary | ICD-10-CM | POA: Insufficient documentation

## 2023-09-16 LAB — URINALYSIS, ROUTINE W REFLEX MICROSCOPIC
Bilirubin Urine: NEGATIVE
Glucose, UA: NEGATIVE mg/dL
Hgb urine dipstick: NEGATIVE
Ketones, ur: NEGATIVE mg/dL
Leukocytes,Ua: NEGATIVE
Nitrite: NEGATIVE
Protein, ur: NEGATIVE mg/dL
Specific Gravity, Urine: 1.003 — ABNORMAL LOW (ref 1.005–1.030)
pH: 6 (ref 5.0–8.0)

## 2023-09-16 LAB — COMPREHENSIVE METABOLIC PANEL WITH GFR
ALT: 24 U/L (ref 0–44)
AST: 28 U/L (ref 15–41)
Albumin: 4.5 g/dL (ref 3.5–5.0)
Alkaline Phosphatase: 70 U/L (ref 38–126)
Anion gap: 12 (ref 5–15)
BUN: 14 mg/dL (ref 6–20)
CO2: 23 mmol/L (ref 22–32)
Calcium: 8.8 mg/dL — ABNORMAL LOW (ref 8.9–10.3)
Chloride: 101 mmol/L (ref 98–111)
Creatinine, Ser: 0.92 mg/dL (ref 0.44–1.00)
GFR, Estimated: >60 mL/min
Glucose, Bld: 102 mg/dL — ABNORMAL HIGH (ref 70–99)
Potassium: 3.9 mmol/L (ref 3.5–5.1)
Sodium: 136 mmol/L (ref 135–145)
Total Bilirubin: 0.7 mg/dL (ref 0.0–1.2)
Total Protein: 7.7 g/dL (ref 6.5–8.1)

## 2023-09-16 LAB — RESP PANEL BY RT-PCR (RSV, FLU A&B, COVID)  RVPGX2
Influenza A by PCR: POSITIVE — AB
Influenza B by PCR: NEGATIVE
Resp Syncytial Virus by PCR: NEGATIVE
SARS Coronavirus 2 by RT PCR: NEGATIVE

## 2023-09-16 LAB — CBC
HCT: 46.4 % — ABNORMAL HIGH (ref 36.0–46.0)
Hemoglobin: 15.1 g/dL — ABNORMAL HIGH (ref 12.0–15.0)
MCH: 31.9 pg (ref 26.0–34.0)
MCHC: 32.5 g/dL (ref 30.0–36.0)
MCV: 98.1 fL (ref 80.0–100.0)
Platelets: 142 10*3/uL — ABNORMAL LOW (ref 150–400)
RBC: 4.73 MIL/uL (ref 3.87–5.11)
RDW: 13.2 % (ref 11.5–15.5)
WBC: 5.3 10*3/uL (ref 4.0–10.5)
nRBC: 0 % (ref 0.0–0.2)

## 2023-09-16 LAB — POC URINE PREG, ED: Preg Test, Ur: NEGATIVE

## 2023-09-16 LAB — LIPASE, BLOOD: Lipase: 34 U/L (ref 11–51)

## 2023-09-16 MED ORDER — ACETAMINOPHEN 500 MG PO TABS
1000.0000 mg | ORAL_TABLET | Freq: Once | ORAL | Status: AC
  Administered 2023-09-16: 1000 mg via ORAL
  Filled 2023-09-16: qty 2

## 2023-09-16 MED ORDER — ONDANSETRON HCL 4 MG/2ML IJ SOLN
4.0000 mg | Freq: Once | INTRAMUSCULAR | Status: AC
  Administered 2023-09-16: 4 mg via INTRAVENOUS
  Filled 2023-09-16: qty 2

## 2023-09-16 MED ORDER — KETOROLAC TROMETHAMINE 30 MG/ML IJ SOLN
15.0000 mg | Freq: Once | INTRAMUSCULAR | Status: AC
  Administered 2023-09-16: 15 mg via INTRAVENOUS
  Filled 2023-09-16: qty 1

## 2023-09-16 MED ORDER — LACTATED RINGERS IV BOLUS
1000.0000 mL | Freq: Once | INTRAVENOUS | Status: AC
  Administered 2023-09-16: 1000 mL via INTRAVENOUS

## 2023-09-16 NOTE — ED Provider Notes (Signed)
   [facility] Provider Note    Event Date/Time   First MD Initiated Contact with Patient 09/16/23 1404     (approximate)  History   Chief Complaint: Weakness  HPI  Dominique Sullivan is a 53 y.o. female with a past medical history of proximal atrial fibrillation on aspirin only, presents to the emergency department for headache weakness and concerns for dehydration.  According to the patient she was experiencing GI symptoms since Friday night including nausea and diarrhea.  States several of her family members were ill with similar symptoms.  States for the last 2 days she has been coughing with a dry cough and subjective fever/chills.  Patient works at a school.  Physical Exam   Triage Vital Signs: ED Triage Vitals  Encounter Vitals Group     BP 09/16/23 1219 (!) 165/98     Systolic BP Percentile --      Diastolic BP Percentile --      Pulse Rate 09/16/23 1219 (!) 105     Resp 09/16/23 1219 16     Temp --      Temp src --      SpO2 09/16/23 1219 94 %     Weight 09/16/23 1220 229 lb 4.5 oz (104 kg)     Height --      Head Circumference --      Peak Flow --      Pain Score 09/16/23 1220 6     Pain Loc --      Pain Education --      Exclude from Growth Chart --     Most recent vital signs: Vitals:   09/16/23 1219  BP: (!) 165/98  Pulse: (!) 105  Resp: 16  SpO2: 94%    General: Awake, no distress.  CV:  Good peripheral perfusion.  Regular rate and rhythm  Resp:  Normal effort.  Equal breath sounds bilaterally.  Abd:  No distention.  Soft, nontender.  No rebound or guarding.  ED Results / Procedures / Treatments   MEDICATIONS ORDERED IN ED: Medications  lactated ringers bolus 1,000 mL (1,000 mLs Intravenous New Bag/Given 09/16/23 1439)  ondansetron (ZOFRAN) injection 4 mg (4 mg Intravenous Given 09/16/23 1438)     IMPRESSION / MDM / ASSESSMENT AND PLAN / ED COURSE  I reviewed the triage vital signs and the nursing notes.  Patient's  presentation is most consistent with acute presentation with potential threat to life or bodily function.  Patient presents to the emergency department for symptoms of nausea diarrhea dehydration and now a dry cough.  Subjective fever/chills at home.  Patient's workup in the emergency department is reassuring including a normal CBC reassuring chemistry and reassuring urinalysis.  Lipase and LFTs are normal.  Patient is describing concerns for dehydration and muscle cramping.  Will IV hydrate 1 L of LR, we will treat with IV Zofran.  Patient's respiratory panel is pending.  Given the patient's reassuring workup anticipate discharge home with continued supportive care.  Patient care signed out to oncoming provider.  FINAL CLINICAL IMPRESSION(S) / ED DIAGNOSES   Dehydration Diarrhea Cough   Note:  This document was prepared using Dragon voice recognition software and may include unintentional dictation errors.   Minna Antis, MD 09/16/23 1453

## 2023-09-16 NOTE — Discharge Instructions (Addendum)
Please drink plenty fluids over the next several days.  Return to the emergency department for any symptom personally concerning to yourself otherwise please follow-up with your PCP this week for recheck/reevaluation.  Please take Tylenol and ibuprofen/Advil for your pain.  It is safe to take them together, or to alternate them every few hours.  Take up to 1000mg  of Tylenol at a time, up to 4 times per day.  Do not take more than 4000 mg of Tylenol in 24 hours.  For ibuprofen, take 400-600 mg, 3 - 4 times per day.

## 2023-09-16 NOTE — ED Provider Notes (Signed)
Patient received in signout from Dr. Lenard Lance for evaluation of generalized weakness and low-grade fevers.  Tested positive for influenza A and otherwise a benign workup.  Feeling much better after Toradol, fluids and Tylenol.  Ambulatory, tolerating p.o. intake and suitable for outpatient management.   Delton Prairie, MD 09/16/23 [phone]

## 2023-09-16 NOTE — ED Provider Triage Note (Signed)
Emergency Medicine Provider Triage Evaluation Note  Dominique Sullivan , a 53 y.o. female  was evaluated in triage.  Pt complains of abdominal cramping with diarrhea over the weekend. Worried about dehydration.  Dry cough  Review of Systems  Positive: Chills once, cough abd pain Negative: No CP  Physical Exam  BP (!) 165/98 (BP Location: Left Arm)   Pulse (!) 105   Resp 16   Wt 104 kg   LMP 02/09/2014 Comment: neg preg test 10/16/17  SpO2 94%   BMI 40.61 kg/m  Gen:   Awake, no distress   Resp:  Normal effort  MSK:   Moves extremities without difficulty  Other:    Medical Decision Making  Medically screening exam initiated at 12:27 PM.  Appropriate orders placed.  Dominique Sullivan was informed that the remainder of the evaluation will be completed by another provider, this initial triage assessment does not replace that evaluation, and the importance of remaining in the ED until their evaluation is complete.     Tommi Rumps, PA-C 09/16/23 1230

## 2023-09-16 NOTE — ED Triage Notes (Signed)
C/O generalized cramping to body. STates had diarrhea Friday night.  Urine was dark, concerned for dehydration.  Also dry cough for a couple of days.
